# Patient Record
Sex: Female | Born: 1935
Health system: Southern US, Community
[De-identification: ages and names within clinical notes are randomized; demographics above are authoritative.]

## PROBLEM LIST (undated history)

## (undated) DIAGNOSIS — H409 Unspecified glaucoma: Secondary | ICD-10-CM

## (undated) DIAGNOSIS — E039 Hypothyroidism, unspecified: Secondary | ICD-10-CM

## (undated) DIAGNOSIS — I10 Essential (primary) hypertension: Secondary | ICD-10-CM

## (undated) DIAGNOSIS — E7800 Pure hypercholesterolemia, unspecified: Secondary | ICD-10-CM

## (undated) DIAGNOSIS — G2581 Restless legs syndrome: Secondary | ICD-10-CM

## (undated) DIAGNOSIS — Z8669 Personal history of other diseases of the nervous system and sense organs: Secondary | ICD-10-CM

## (undated) DIAGNOSIS — K219 Gastro-esophageal reflux disease without esophagitis: Secondary | ICD-10-CM

## (undated) DIAGNOSIS — M810 Age-related osteoporosis without current pathological fracture: Secondary | ICD-10-CM

## (undated) DIAGNOSIS — IMO0002 Reserved for concepts with insufficient information to code with codable children: Secondary | ICD-10-CM

## (undated) DIAGNOSIS — Z8619 Personal history of other infectious and parasitic diseases: Secondary | ICD-10-CM

## (undated) DIAGNOSIS — E559 Vitamin D deficiency, unspecified: Secondary | ICD-10-CM

## (undated) HISTORY — DX: Gastro-esophageal reflux disease without esophagitis: K21.9

## (undated) HISTORY — DX: Personal history of other diseases of the nervous system and sense organs: Z86.69

## (undated) HISTORY — DX: Reserved for concepts with insufficient information to code with codable children: IMO0002

## (undated) HISTORY — DX: Hypothyroidism, unspecified: E03.9

## (undated) HISTORY — DX: Personal history of other infectious and parasitic diseases: Z86.19

## (undated) HISTORY — DX: Unspecified glaucoma: H40.9

## (undated) HISTORY — DX: Age-related osteoporosis without current pathological fracture: M81.0

## (undated) HISTORY — DX: Pure hypercholesterolemia, unspecified: E78.00

## (undated) HISTORY — DX: Restless legs syndrome: G25.81

## (undated) HISTORY — DX: Vitamin D deficiency, unspecified: E55.9

## (undated) HISTORY — DX: Essential (primary) hypertension: I10

---

## 1966-11-12 HISTORY — PX: APPENDECTOMY: SHX54

## 1966-11-12 HISTORY — PX: ABDOMINAL HYSTERECTOMY: SHX81

## 2005-02-05 ENCOUNTER — Ambulatory Visit: Payer: Self-pay | Admitting: Internal Medicine

## 2005-03-09 ENCOUNTER — Emergency Department: Payer: Self-pay | Admitting: Emergency Medicine

## 2005-05-14 ENCOUNTER — Ambulatory Visit: Payer: Self-pay | Admitting: Internal Medicine

## 2005-08-16 ENCOUNTER — Emergency Department: Payer: Self-pay | Admitting: Internal Medicine

## 2005-08-16 ENCOUNTER — Other Ambulatory Visit: Payer: Self-pay

## 2005-12-28 ENCOUNTER — Ambulatory Visit: Payer: Self-pay | Admitting: Internal Medicine

## 2006-05-23 ENCOUNTER — Ambulatory Visit: Payer: Self-pay | Admitting: Internal Medicine

## 2006-05-30 ENCOUNTER — Ambulatory Visit: Payer: Self-pay | Admitting: Internal Medicine

## 2006-07-19 ENCOUNTER — Ambulatory Visit: Payer: Self-pay | Admitting: Internal Medicine

## 2007-01-17 ENCOUNTER — Ambulatory Visit: Payer: Self-pay | Admitting: Internal Medicine

## 2007-01-22 ENCOUNTER — Ambulatory Visit: Payer: Self-pay | Admitting: Internal Medicine

## 2007-05-27 ENCOUNTER — Ambulatory Visit: Payer: Self-pay | Admitting: Internal Medicine

## 2007-08-08 ENCOUNTER — Ambulatory Visit: Payer: Self-pay | Admitting: Internal Medicine

## 2007-12-20 ENCOUNTER — Inpatient Hospital Stay: Payer: Self-pay | Admitting: Unknown Physician Specialty

## 2007-12-20 ENCOUNTER — Other Ambulatory Visit: Payer: Self-pay

## 2008-05-31 ENCOUNTER — Ambulatory Visit: Payer: Self-pay | Admitting: Internal Medicine

## 2009-06-16 ENCOUNTER — Ambulatory Visit: Payer: Self-pay | Admitting: Internal Medicine

## 2010-08-09 ENCOUNTER — Ambulatory Visit: Payer: Self-pay | Admitting: Internal Medicine

## 2010-08-16 ENCOUNTER — Inpatient Hospital Stay: Payer: Self-pay | Admitting: Internal Medicine

## 2010-08-22 LAB — PATHOLOGY REPORT

## 2010-11-17 ENCOUNTER — Ambulatory Visit: Payer: Self-pay | Admitting: Internal Medicine

## 2011-08-13 ENCOUNTER — Ambulatory Visit: Payer: Self-pay | Admitting: Internal Medicine

## 2012-01-02 DIAGNOSIS — R5381 Other malaise: Secondary | ICD-10-CM | POA: Diagnosis not present

## 2012-01-02 DIAGNOSIS — E559 Vitamin D deficiency, unspecified: Secondary | ICD-10-CM | POA: Diagnosis not present

## 2012-01-02 DIAGNOSIS — E7800 Pure hypercholesterolemia, unspecified: Secondary | ICD-10-CM | POA: Diagnosis not present

## 2012-01-10 DIAGNOSIS — E7800 Pure hypercholesterolemia, unspecified: Secondary | ICD-10-CM | POA: Diagnosis not present

## 2012-01-10 DIAGNOSIS — R1013 Epigastric pain: Secondary | ICD-10-CM | POA: Diagnosis not present

## 2012-01-10 DIAGNOSIS — R1310 Dysphagia, unspecified: Secondary | ICD-10-CM | POA: Diagnosis not present

## 2012-01-10 DIAGNOSIS — I10 Essential (primary) hypertension: Secondary | ICD-10-CM | POA: Diagnosis not present

## 2012-01-14 ENCOUNTER — Ambulatory Visit: Payer: Self-pay | Admitting: Internal Medicine

## 2012-01-14 DIAGNOSIS — N6459 Other signs and symptoms in breast: Secondary | ICD-10-CM | POA: Diagnosis not present

## 2012-01-14 DIAGNOSIS — N644 Mastodynia: Secondary | ICD-10-CM | POA: Diagnosis not present

## 2012-01-16 ENCOUNTER — Ambulatory Visit: Payer: Self-pay | Admitting: Internal Medicine

## 2012-01-16 DIAGNOSIS — R928 Other abnormal and inconclusive findings on diagnostic imaging of breast: Secondary | ICD-10-CM | POA: Diagnosis not present

## 2012-01-17 ENCOUNTER — Ambulatory Visit: Payer: Self-pay | Admitting: Internal Medicine

## 2012-01-17 DIAGNOSIS — R109 Unspecified abdominal pain: Secondary | ICD-10-CM | POA: Diagnosis not present

## 2012-01-17 DIAGNOSIS — N1330 Unspecified hydronephrosis: Secondary | ICD-10-CM | POA: Diagnosis not present

## 2012-01-21 DIAGNOSIS — Z1211 Encounter for screening for malignant neoplasm of colon: Secondary | ICD-10-CM | POA: Diagnosis not present

## 2012-02-07 DIAGNOSIS — K219 Gastro-esophageal reflux disease without esophagitis: Secondary | ICD-10-CM | POA: Diagnosis not present

## 2012-02-07 DIAGNOSIS — R1310 Dysphagia, unspecified: Secondary | ICD-10-CM | POA: Diagnosis not present

## 2012-02-07 DIAGNOSIS — I10 Essential (primary) hypertension: Secondary | ICD-10-CM | POA: Diagnosis not present

## 2012-02-07 DIAGNOSIS — E7800 Pure hypercholesterolemia, unspecified: Secondary | ICD-10-CM | POA: Diagnosis not present

## 2012-05-01 DIAGNOSIS — Z79899 Other long term (current) drug therapy: Secondary | ICD-10-CM | POA: Diagnosis not present

## 2012-05-01 DIAGNOSIS — I10 Essential (primary) hypertension: Secondary | ICD-10-CM | POA: Diagnosis not present

## 2012-05-01 DIAGNOSIS — E559 Vitamin D deficiency, unspecified: Secondary | ICD-10-CM | POA: Diagnosis not present

## 2012-05-01 DIAGNOSIS — E7800 Pure hypercholesterolemia, unspecified: Secondary | ICD-10-CM | POA: Diagnosis not present

## 2012-05-09 DIAGNOSIS — K219 Gastro-esophageal reflux disease without esophagitis: Secondary | ICD-10-CM | POA: Diagnosis not present

## 2012-05-09 DIAGNOSIS — I10 Essential (primary) hypertension: Secondary | ICD-10-CM | POA: Diagnosis not present

## 2012-05-09 DIAGNOSIS — E7800 Pure hypercholesterolemia, unspecified: Secondary | ICD-10-CM | POA: Diagnosis not present

## 2012-07-24 DIAGNOSIS — H04129 Dry eye syndrome of unspecified lacrimal gland: Secondary | ICD-10-CM | POA: Diagnosis not present

## 2012-08-25 DIAGNOSIS — I10 Essential (primary) hypertension: Secondary | ICD-10-CM | POA: Diagnosis not present

## 2012-08-25 DIAGNOSIS — R0602 Shortness of breath: Secondary | ICD-10-CM | POA: Diagnosis not present

## 2012-08-25 DIAGNOSIS — E782 Mixed hyperlipidemia: Secondary | ICD-10-CM | POA: Diagnosis not present

## 2012-10-10 ENCOUNTER — Encounter: Payer: Self-pay | Admitting: Internal Medicine

## 2012-10-21 ENCOUNTER — Encounter: Payer: Self-pay | Admitting: *Deleted

## 2012-10-22 ENCOUNTER — Telehealth: Payer: Self-pay | Admitting: Internal Medicine

## 2012-10-22 ENCOUNTER — Encounter: Payer: Self-pay | Admitting: Internal Medicine

## 2012-10-22 ENCOUNTER — Ambulatory Visit (INDEPENDENT_AMBULATORY_CARE_PROVIDER_SITE_OTHER): Payer: Medicare Other | Admitting: Internal Medicine

## 2012-10-22 VITALS — BP 138/82 | HR 75 | Temp 98.3°F | Ht 63.5 in | Wt 122.5 lb

## 2012-10-22 DIAGNOSIS — E7800 Pure hypercholesterolemia, unspecified: Secondary | ICD-10-CM

## 2012-10-22 DIAGNOSIS — E039 Hypothyroidism, unspecified: Secondary | ICD-10-CM | POA: Diagnosis not present

## 2012-10-22 DIAGNOSIS — R5383 Other fatigue: Secondary | ICD-10-CM

## 2012-10-22 DIAGNOSIS — L989 Disorder of the skin and subcutaneous tissue, unspecified: Secondary | ICD-10-CM

## 2012-10-22 DIAGNOSIS — K219 Gastro-esophageal reflux disease without esophagitis: Secondary | ICD-10-CM | POA: Diagnosis not present

## 2012-10-22 DIAGNOSIS — R5381 Other malaise: Secondary | ICD-10-CM | POA: Diagnosis not present

## 2012-10-22 DIAGNOSIS — M810 Age-related osteoporosis without current pathological fracture: Secondary | ICD-10-CM | POA: Insufficient documentation

## 2012-10-22 DIAGNOSIS — Z23 Encounter for immunization: Secondary | ICD-10-CM | POA: Diagnosis not present

## 2012-10-22 DIAGNOSIS — I10 Essential (primary) hypertension: Secondary | ICD-10-CM

## 2012-10-22 LAB — CBC WITH DIFFERENTIAL/PLATELET
Eosinophils Relative: 1.5 % (ref 0.0–5.0)
Lymphocytes Relative: 35.1 % (ref 12.0–46.0)
MCV: 89.7 fl (ref 78.0–100.0)
Monocytes Absolute: 0.5 10*3/uL (ref 0.1–1.0)
Neutrophils Relative %: 53.8 % (ref 43.0–77.0)
Platelets: 249 10*3/uL (ref 150.0–400.0)
WBC: 5.2 10*3/uL (ref 4.5–10.5)

## 2012-10-22 LAB — COMPREHENSIVE METABOLIC PANEL
ALT: 18 U/L (ref 0–35)
AST: 23 U/L (ref 0–37)
Albumin: 4.4 g/dL (ref 3.5–5.2)
Alkaline Phosphatase: 103 U/L (ref 39–117)
Calcium: 8.9 mg/dL (ref 8.4–10.5)
Chloride: 103 mEq/L (ref 96–112)
Potassium: 4.3 mEq/L (ref 3.5–5.1)
Sodium: 140 mEq/L (ref 135–145)

## 2012-10-22 LAB — LDL CHOLESTEROL, DIRECT: Direct LDL: 148.9 mg/dL

## 2012-10-22 LAB — LIPID PANEL
Cholesterol: 255 mg/dL — ABNORMAL HIGH (ref 0–200)
Total CHOL/HDL Ratio: 3
VLDL: 17 mg/dL (ref 0.0–40.0)

## 2012-10-22 LAB — POCT INFLUENZA A/B

## 2012-10-22 MED ORDER — ALBUTEROL SULFATE HFA 108 (90 BASE) MCG/ACT IN AERS: 2.0000 | INHALATION_SPRAY | Freq: Four times a day (QID) | RESPIRATORY_TRACT | Status: DC | PRN

## 2012-10-22 MED ORDER — PRAVASTATIN SODIUM 10 MG PO TABS: 10.0000 mg | ORAL_TABLET | Freq: Every day | ORAL | Status: DC

## 2012-10-22 MED ORDER — CLONAZEPAM 0.5 MG PO TABS: ORAL_TABLET | ORAL | Status: DC

## 2012-10-22 NOTE — Telephone Encounter (Signed)
Pt notified of lab results and need to start cholesterol medication.  Will start pravastatin 10mg  q mon, wed and Friday.  She is aware of need for liver recheck in 6 weeks.  She is coming 12/03/12 (9:30) for labs.  Please put on lab schedule.  Thanks.

## 2012-10-22 NOTE — Progress Notes (Signed)
  Subjective:    [name], female    DOB: Dec 25, 1932, 78 y.o.   MRN: [id number]  HPI 78 year old female with past history of hypertension, hypercholesterolemia, hypothyroidism and GERD who comes in today for a scheduled follow up.  She states overall she has been doing relatively well.  Her main complaint is that of not being able to sleep.  She has taken short acting meds in the past and tolerates, but still will not sleep through the night.  Needs something to help her sleep.  No chest pain or tightness.  Breathing is stable.  Saw Dr Gwen Pounds 9-10/13.  Felt things were stable.  No nausea or vomiting.  Bowels stable.  Blood pressures overall under reasonable control.    Past Medical History  Diagnosis Date  . Hypertension   . Hypothyroidism   . Hypercholesterolemia   . GERD (gastroesophageal reflux disease)   . Osteoporosis   . Glaucoma   . Restless legs   . History of migraine headaches   . Cystocele     stress incontinence, pessary  . Vitamin D deficiency   . History of chicken pox     Review of Systems Patient denies any headache, lightheadedness or dizziness.  No significant sinus symptoms.  No chest pain, tightness or palpitations.  No increased shortness of breath, cough or congestion.  No nausea or vomiting.  No acid reflux.  No abdominal pain or cramping.  No bowel change, such as diarrhea, constipation, BRBPR or melana.  No urine change.  Trouble sleeping as outlined.  Desires to have medication to help her sleep.  No depression.  Eating and drinking well.        Objective:   Physical Exam Filed Vitals:   10/22/12 1148  BP: 138/82  Pulse: 75  Temp: 98.3 F (59.99 C)   78 year old female in no acute distress.   HEENT:  Nares - clear.  OP- without lesions or erythema.  NECK:  Supple, nontender.   HEART:  Appears to be regular. LUNGS:  Without crackles or wheezing audible.  Respirations even and unlabored.   RADIAL PULSE:  Equal bilaterally.  ABDOMEN:   Soft, nontender.  No audible abdominal bruit.   EXTREMITIES:  No increased edema to be present.                     Assessment & Plan:  GI.  Has had no further bleeding.  Colonoscopy 10/11 revealed congested mucosa and internal hemorrhoids.  On Protonix.  Asymptomatic.    CARDIOVASCULAR.  Sees Dr Gwen Pounds.  Stress test 08/31/10 negative for ischemia.  Continue risk factor modification.    PULMONARY.  Breathing stable.  Follow.    RIGHT CAROTID BRUIT.  Carotid ultrasound 11/17/10 revealed no hemodynamically significant stenosis.  Continue daily aspirin.   DIFFICULTY SLEEPING.  Clonazepam .5mg  1/2 tab q hs prn.  Follow closely.  Discussed possible side effects of medication.  Follow.   HEALTH MAINTENANCE.  Physical 01/10/12.  Mammogram 08/13/11 - BiRADS II.  Obtain results from 2013 mammogram.  Colonoscopy as outlined.  Hemoccult cards negative 01/21/12.

## 2012-10-22 NOTE — Assessment & Plan Note (Signed)
On Protonix.  Asymptomatic.    

## 2012-10-26 ENCOUNTER — Encounter: Payer: Self-pay | Admitting: Internal Medicine

## 2012-10-26 NOTE — Assessment & Plan Note (Signed)
Blood pressure as outlined.  Same meds.  Check metabolic panel.

## 2012-10-26 NOTE — Assessment & Plan Note (Signed)
Low cholesterol diet and exercise.  Check lipid panel.   

## 2012-10-26 NOTE — Assessment & Plan Note (Signed)
Follow vitamin D level.  Continue calcium and vitamin D.  Obtain results of last bone density.

## 2012-10-26 NOTE — Assessment & Plan Note (Signed)
On synthroid.  Follow tsh.   

## 2012-11-06 ENCOUNTER — Other Ambulatory Visit: Payer: Self-pay | Admitting: Internal Medicine

## 2012-11-06 NOTE — Telephone Encounter (Signed)
Sent in to pharmacy.  

## 2012-12-03 ENCOUNTER — Other Ambulatory Visit (INDEPENDENT_AMBULATORY_CARE_PROVIDER_SITE_OTHER): Payer: Medicare Other

## 2012-12-03 DIAGNOSIS — E7800 Pure hypercholesterolemia, unspecified: Secondary | ICD-10-CM | POA: Diagnosis not present

## 2012-12-03 LAB — HEPATIC FUNCTION PANEL
ALT: 17 U/L (ref 0–35)
AST: 25 U/L (ref 0–37)
Albumin: 4 g/dL (ref 3.5–5.2)
Total Bilirubin: 0.6 mg/dL (ref 0.3–1.2)

## 2012-12-04 ENCOUNTER — Other Ambulatory Visit: Payer: Self-pay | Admitting: Internal Medicine

## 2012-12-04 DIAGNOSIS — R7989 Other specified abnormal findings of blood chemistry: Secondary | ICD-10-CM

## 2012-12-04 DIAGNOSIS — I10 Essential (primary) hypertension: Secondary | ICD-10-CM

## 2012-12-04 DIAGNOSIS — E039 Hypothyroidism, unspecified: Secondary | ICD-10-CM

## 2012-12-04 DIAGNOSIS — L578 Other skin changes due to chronic exposure to nonionizing radiation: Secondary | ICD-10-CM | POA: Diagnosis not present

## 2012-12-04 DIAGNOSIS — L820 Inflamed seborrheic keratosis: Secondary | ICD-10-CM | POA: Diagnosis not present

## 2012-12-04 DIAGNOSIS — L821 Other seborrheic keratosis: Secondary | ICD-10-CM | POA: Diagnosis not present

## 2012-12-04 DIAGNOSIS — D239 Other benign neoplasm of skin, unspecified: Secondary | ICD-10-CM | POA: Diagnosis not present

## 2012-12-04 DIAGNOSIS — E7800 Pure hypercholesterolemia, unspecified: Secondary | ICD-10-CM

## 2012-12-04 NOTE — Progress Notes (Signed)
Orders placed for follow up labs - to be done prior to cpe

## 2013-01-21 ENCOUNTER — Other Ambulatory Visit (INDEPENDENT_AMBULATORY_CARE_PROVIDER_SITE_OTHER): Payer: Medicare Other

## 2013-01-21 DIAGNOSIS — E7800 Pure hypercholesterolemia, unspecified: Secondary | ICD-10-CM | POA: Diagnosis not present

## 2013-01-21 DIAGNOSIS — I10 Essential (primary) hypertension: Secondary | ICD-10-CM | POA: Diagnosis not present

## 2013-01-21 DIAGNOSIS — E039 Hypothyroidism, unspecified: Secondary | ICD-10-CM | POA: Diagnosis not present

## 2013-01-21 LAB — HEPATIC FUNCTION PANEL
ALT: 12 U/L (ref 0–35)
AST: 23 U/L (ref 0–37)
Albumin: 4.1 g/dL (ref 3.5–5.2)

## 2013-01-21 LAB — BASIC METABOLIC PANEL
CO2: 24 mEq/L (ref 19–32)
Calcium: 8.9 mg/dL (ref 8.4–10.5)
Glucose, Bld: 104 mg/dL — ABNORMAL HIGH (ref 70–99)
Potassium: 3.6 mEq/L (ref 3.5–5.1)
Sodium: 140 mEq/L (ref 135–145)

## 2013-01-21 LAB — LIPID PANEL
HDL: 83.9 mg/dL (ref >39.00)
Total CHOL/HDL Ratio: 2
Triglycerides: 66 mg/dL (ref 0.0–149.0)
VLDL: 13.2 mg/dL (ref 0.0–40.0)

## 2013-01-21 LAB — TSH: TSH: 2.93 u[IU]/mL (ref 0.35–5.50)

## 2013-01-23 ENCOUNTER — Ambulatory Visit (INDEPENDENT_AMBULATORY_CARE_PROVIDER_SITE_OTHER): Payer: Medicare Other | Admitting: Internal Medicine

## 2013-01-23 ENCOUNTER — Encounter: Payer: Self-pay | Admitting: Internal Medicine

## 2013-01-23 VITALS — BP 130/70 | HR 83 | Temp 97.8°F | Ht 63.5 in | Wt 124.2 lb

## 2013-01-23 DIAGNOSIS — E039 Hypothyroidism, unspecified: Secondary | ICD-10-CM | POA: Diagnosis not present

## 2013-01-23 DIAGNOSIS — K219 Gastro-esophageal reflux disease without esophagitis: Secondary | ICD-10-CM

## 2013-01-23 DIAGNOSIS — E7800 Pure hypercholesterolemia, unspecified: Secondary | ICD-10-CM | POA: Diagnosis not present

## 2013-01-23 DIAGNOSIS — M810 Age-related osteoporosis without current pathological fracture: Secondary | ICD-10-CM

## 2013-01-23 DIAGNOSIS — I10 Essential (primary) hypertension: Secondary | ICD-10-CM

## 2013-01-23 MED ORDER — CLONAZEPAM 0.5 MG PO TABS: ORAL_TABLET | ORAL | Status: DC

## 2013-01-23 MED ORDER — SERTRALINE HCL 50 MG PO TABS: 50.0000 mg | ORAL_TABLET | Freq: Every day | ORAL | Status: DC

## 2013-01-23 MED ORDER — PRAVASTATIN SODIUM 10 MG PO TABS: 10.0000 mg | ORAL_TABLET | Freq: Every day | ORAL | Status: DC

## 2013-01-25 ENCOUNTER — Encounter: Payer: Self-pay | Admitting: Internal Medicine

## 2013-01-25 NOTE — Assessment & Plan Note (Signed)
On synthroid.  Follow tsh.   

## 2013-01-25 NOTE — Progress Notes (Signed)
Subjective:    [name], female    DOB: 02-10-1933, 79 y.o.   MRN: [id number]  HPI 79 year old female with past history of hypertension, hypercholesterolemia, hypothyroidism and GERD who comes in today for a scheduled follow up.  She states overall she has been doing relatively well.  Her main complaint is that of not being able to sleep.  The clonazepam will work for a while, but she is still waking up through the night.  Does worry.  Some question of increased anxiety. No chest pain or tightness.  Breathing is stable.  Saw Dr Gwen Pounds 9-10/13.  Felt things were stable.  No nausea or vomiting.  Bowels stable.  Blood pressures overall under reasonable control.    Past Medical History  Diagnosis Date  . Hypertension   . Hypothyroidism   . Hypercholesterolemia   . GERD (gastroesophageal reflux disease)   . Osteoporosis   . Glaucoma   . Restless legs   . History of migraine headaches   . Cystocele     stress incontinence, pessary  . Vitamin D deficiency   . History of chicken pox     Current Outpatient Prescriptions on File Prior to Visit  Medication Sig Dispense Refill  . albuterol (PROVENTIL HFA;VENTOLIN HFA) 108 (90 BASE) MCG/ACT inhaler Inhale 2 puffs into the lungs every 6 (six) hours as needed for wheezing.  1 Inhaler  1  . amLODipine (NORVASC) 5 MG tablet Take 5 mg by mouth daily.      Marland Kitchen aspirin 81 MG tablet Take 81 mg by mouth daily.      . Calcium Carbonate-Vit D-Min 600-400 MG-UNIT TABS Take by mouth 2 (two) times daily. Take 1 tablet by mouth twice a day      . carbidopa-levodopa (SINEMET IR) 10-100 MG per tablet Take 1 tablet by mouth once. At Bedtime      . Cholecalciferol (VITAMIN D3) 2000 UNITS capsule Take 2,000 Units by mouth daily.      Marland Kitchen levothyroxine (SYNTHROID, LEVOTHROID) 75 MCG tablet Take 75 mcg by mouth daily.      . pantoprazole (PROTONIX) 40 MG tablet TAKE ONE (1) TABLET EACH DAY  90 tablet  1  . telmisartan (MICARDIS) 80 MG tablet Take 80  mg by mouth daily.       No current facility-administered medications on file prior to visit.    Review of Systems Patient denies any headache, lightheadedness or dizziness.  No significant sinus symptoms.  No chest pain, tightness or palpitations.  No increased shortness of breath, cough or congestion.  No nausea or vomiting.  No acid reflux.  No abdominal pain or cramping.  No bowel change, such as diarrhea, constipation, BRBPR or melana.  No urine change.  Trouble sleeping as outlined. Increased worrying.  States her mind won't shut down.   Eating and drinking well.        Objective:   Physical Exam  Filed Vitals:   01/23/13 1449  BP: 130/70  Pulse: 83  Temp: 97.8 F (36.6 C)   Blood pressure recheck:  21/98  79 year old female in no acute distress.   HEENT:  Nares - clear.  OP- without lesions or erythema.  NECK:  Supple, nontender.   HEART:  Appears to be regular. LUNGS:  Without crackles or wheezing audible.  Respirations even and unlabored.   RADIAL PULSE:  Equal bilaterally.  ABDOMEN:  Soft, nontender.  No audible abdominal bruit.   EXTREMITIES:  No increased edema  to be present.                     Assessment & Plan:  GI.  Has had no further bleeding.  Colonoscopy 10/11 revealed congested mucosa and internal hemorrhoids.  On Protonix.  Asymptomatic.    CARDIOVASCULAR.  Sees Dr Gwen Pounds.  Stress test 08/31/10 negative for ischemia.  Continue risk factor modification.    PULMONARY.  Breathing stable.  Follow.    RIGHT CAROTID BRUIT.  Carotid ultrasound 11/17/10 revealed no hemodynamically significant stenosis.  Continue daily aspirin.   DIFFICULTY SLEEPING.  Will continue clonazepam .5mg  q hs prn.  Will also start her on Zologt 25mg  q day and increase to 50mg  q day after one week.  Get her back in soon to reassess.  Any problems before, she is to call or be evaluated.   HEALTH MAINTENANCE.  Physical 01/10/12.  Mammogram 01/14/12 recommended follow up views.  Follow up  views 01/16/12 - BiRADS II.   Colonoscopy as outlined.  Hemoccult cards negative 01/21/12.

## 2013-01-25 NOTE — Assessment & Plan Note (Signed)
Blood pressure as outlined.  Same meds.  Follow metabolic panel.   

## 2013-01-25 NOTE — Assessment & Plan Note (Signed)
On Protonix.  Asymptomatic.    

## 2013-01-25 NOTE — Assessment & Plan Note (Signed)
Low cholesterol diet and exercise.  Follow lipid panel.   

## 2013-01-25 NOTE — Assessment & Plan Note (Signed)
Follow vitamin D level.  Continue calcium and vitamin D.

## 2013-02-02 DIAGNOSIS — L821 Other seborrheic keratosis: Secondary | ICD-10-CM | POA: Diagnosis not present

## 2013-02-02 DIAGNOSIS — L820 Inflamed seborrheic keratosis: Secondary | ICD-10-CM | POA: Diagnosis not present

## 2013-02-02 DIAGNOSIS — L578 Other skin changes due to chronic exposure to nonionizing radiation: Secondary | ICD-10-CM | POA: Diagnosis not present

## 2013-02-12 ENCOUNTER — Other Ambulatory Visit: Payer: Self-pay | Admitting: Internal Medicine

## 2013-02-12 NOTE — Telephone Encounter (Signed)
Med filled.  

## 2013-03-05 ENCOUNTER — Encounter: Payer: Self-pay | Admitting: Internal Medicine

## 2013-03-05 ENCOUNTER — Ambulatory Visit (INDEPENDENT_AMBULATORY_CARE_PROVIDER_SITE_OTHER): Payer: Medicare Other | Admitting: Internal Medicine

## 2013-03-05 VITALS — BP 130/80 | HR 82 | Temp 98.5°F | Ht 63.5 in | Wt 124.0 lb

## 2013-03-05 DIAGNOSIS — I10 Essential (primary) hypertension: Secondary | ICD-10-CM

## 2013-03-05 DIAGNOSIS — M810 Age-related osteoporosis without current pathological fracture: Secondary | ICD-10-CM

## 2013-03-05 DIAGNOSIS — E7800 Pure hypercholesterolemia, unspecified: Secondary | ICD-10-CM | POA: Diagnosis not present

## 2013-03-05 DIAGNOSIS — Z1239 Encounter for other screening for malignant neoplasm of breast: Secondary | ICD-10-CM | POA: Diagnosis not present

## 2013-03-05 DIAGNOSIS — K219 Gastro-esophageal reflux disease without esophagitis: Secondary | ICD-10-CM

## 2013-03-05 DIAGNOSIS — E039 Hypothyroidism, unspecified: Secondary | ICD-10-CM

## 2013-03-05 MED ORDER — CLONAZEPAM 0.5 MG PO TABS: ORAL_TABLET | ORAL | Status: DC

## 2013-03-05 MED ORDER — TELMISARTAN 80 MG PO TABS: 80.0000 mg | ORAL_TABLET | Freq: Every day | ORAL | Status: DC

## 2013-03-05 MED ORDER — PRAVASTATIN SODIUM 10 MG PO TABS: 10.0000 mg | ORAL_TABLET | Freq: Every day | ORAL | Status: DC

## 2013-03-08 ENCOUNTER — Encounter: Payer: Self-pay | Admitting: Internal Medicine

## 2013-03-08 NOTE — Assessment & Plan Note (Signed)
Follow vitamin D level.  Continue calcium and vitamin D.

## 2013-03-08 NOTE — Assessment & Plan Note (Signed)
Low cholesterol diet and exercise.  Follow lipid panel.  Cholesterol just checked 01/21/13 - total cholesterol 198, triglycerides 66, HDL 84 and LDL 101.

## 2013-03-08 NOTE — Assessment & Plan Note (Signed)
On Protonix.  Asymptomatic.    

## 2013-03-08 NOTE — Assessment & Plan Note (Signed)
Blood pressure as outlined.  Same meds.  Follow metabolic panel.   

## 2013-03-08 NOTE — Progress Notes (Signed)
Subjective:    [name], female    DOB: Jul 22, 1933, 79 y.o.   MRN: [id number]  HPI 79 year old female with past history of hypertension, hypercholesterolemia, hypothyroidism and GERD who comes in today to follow up on these issues as well as for a complete physical exam. She states overall she has been doing relatively well.  Her main complaint is that of pain in the vein at the base of her left thumb.  No pain in the joint.  No increased erythema.  She states there was increased warmth.  No pain with rotation of her thumb.  Pain localized to the vein.  No injury or trauma. No chest pain or tightness.  Breathing is stable.  Saw Dr Gwen Pounds 9-10/13.  Felt things were stable.  No nausea or vomiting.  Bowels stable.  Blood pressures have been controlled.     Past Medical History  Diagnosis Date  . Hypertension   . Hypothyroidism   . Hypercholesterolemia   . GERD (gastroesophageal reflux disease)   . Osteoporosis   . Glaucoma   . Restless legs   . History of migraine headaches   . Cystocele     stress incontinence, pessary  . Vitamin D deficiency   . History of chicken pox     Current Outpatient Prescriptions on File Prior to Visit  Medication Sig Dispense Refill  . albuterol (PROVENTIL HFA;VENTOLIN HFA) 108 (90 BASE) MCG/ACT inhaler Inhale 2 puffs into the lungs every 6 (six) hours as needed for wheezing.  1 Inhaler  1  . amLODipine (NORVASC) 5 MG tablet TAKE ONE (1) TABLET EACH DAY  90 tablet  1  . aspirin 81 MG tablet Take 81 mg by mouth daily.      . Calcium Carbonate-Vit D-Min 600-400 MG-UNIT TABS Take by mouth 2 (two) times daily. Take 1 tablet by mouth twice a day      . carbidopa-levodopa (SINEMET IR) 10-100 MG per tablet TAKE ONE TABLET AT BEDTIME  90 tablet  1  . Cholecalciferol (VITAMIN D3) 2000 UNITS capsule Take 2,000 Units by mouth daily.      Marland Kitchen levothyroxine (SYNTHROID, LEVOTHROID) 75 MCG tablet TAKE ONE (1) TABLET EACH DAY  90 tablet  1  . pantoprazole  (PROTONIX) 40 MG tablet TAKE ONE (1) TABLET EACH DAY  90 tablet  1  . sertraline (ZOLOFT) 50 MG tablet Take 1 tablet (50 mg total) by mouth daily.  30 tablet  2   No current facility-administered medications on file prior to visit.    Review of Systems Patient denies any headache, lightheadedness or dizziness.  No significant sinus symptoms.  No chest pain, tightness or palpitations.  No increased shortness of breath, cough or congestion.  No nausea or vomiting.  No acid reflux.  No abdominal pain or cramping.  No bowel change, such as diarrhea, constipation, BRBPR or melana.  No urine change.  Eating and drinking well.  Pain over the vein - base of her left thumb as outlined.       Objective:   Physical Exam  Filed Vitals:   03/05/13 1514  BP: 130/80  Pulse: 82  Temp: 98.5 F (18.1 C)   79 year old female in no acute distress.   HEENT:  Nares- clear.  Oropharynx - without lesions. NECK:  Supple.  Nontender.  No audible bruit.  HEART:  Appears to be regular. LUNGS:  No crackles or wheezing audible.  Respirations even and unlabored.  RADIAL PULSE:  Equal bilaterally.    BREASTS:  No nipple discharge or nipple retraction present.  Could not appreciate any distinct nodules or axillary adenopathy.  ABDOMEN:  Soft, nontender.  Bowel sounds present and normal.  No audible abdominal bruit.  GU:  Normal external genitalia.  Vaginal vault without lesions.  S/p hysterectomy.   Could not appreciate any adnexal masses or tenderness.   RECTAL:  Heme negative.   EXTREMITIES:  No increased edema present.  DP pulses palpable and equal bilaterally.  MSK:  Increased tenderness to palpation over the vein at the base of the left thumb.  No increased erythema.  No pain in the joint.  No pain with movement of the thumb.  No bruising.            Assessment & Plan:  QUESTION OF PHLEBITIS.  Pain localized over the vein - base of left thumb.  Treat with gentle use of antiinflammatories.  Follow.  Will call  with update over the next few days.  Follow.  No evidence of infection.   GI.  Has had no further bleeding.  Colonoscopy 10/11 revealed congested mucosa and internal hemorrhoids.  On Protonix.  Asymptomatic.    CARDIOVASCULAR.  Sees Dr Gwen Pounds.  Stress test 08/31/10 negative for ischemia.  Continue risk factor modification.    PULMONARY.  Breathing stable.  Follow.    RIGHT CAROTID BRUIT.  Carotid ultrasound 11/17/10 revealed no hemodynamically significant stenosis.  Continue daily aspirin.   DIFFICULTY SLEEPING.  On clonazepam .5mg  q hs prn.  Also on Zoloft 50mg  q day.  Doing better.  Follow.    HEALTH MAINTENANCE.  Physical today.  Mammogram 01/14/12 recommended follow up views.  Follow up views 01/16/12 - BiRADS II.   Schedule follow up mammogram.   Colonoscopy as outlined.

## 2013-03-08 NOTE — Assessment & Plan Note (Signed)
On synthroid.  Follow tsh.   

## 2013-03-31 DIAGNOSIS — L819 Disorder of pigmentation, unspecified: Secondary | ICD-10-CM | POA: Diagnosis not present

## 2013-03-31 DIAGNOSIS — L821 Other seborrheic keratosis: Secondary | ICD-10-CM | POA: Diagnosis not present

## 2013-03-31 DIAGNOSIS — L820 Inflamed seborrheic keratosis: Secondary | ICD-10-CM | POA: Diagnosis not present

## 2013-03-31 DIAGNOSIS — L578 Other skin changes due to chronic exposure to nonionizing radiation: Secondary | ICD-10-CM | POA: Diagnosis not present

## 2013-04-01 ENCOUNTER — Ambulatory Visit: Payer: Self-pay | Admitting: Internal Medicine

## 2013-04-01 DIAGNOSIS — Z1231 Encounter for screening mammogram for malignant neoplasm of breast: Secondary | ICD-10-CM | POA: Diagnosis not present

## 2013-04-01 DIAGNOSIS — N6459 Other signs and symptoms in breast: Secondary | ICD-10-CM | POA: Diagnosis not present

## 2013-04-02 ENCOUNTER — Telehealth: Payer: Self-pay | Admitting: Internal Medicine

## 2013-04-02 ENCOUNTER — Ambulatory Visit: Payer: Self-pay | Admitting: Internal Medicine

## 2013-04-02 DIAGNOSIS — N6459 Other signs and symptoms in breast: Secondary | ICD-10-CM | POA: Diagnosis not present

## 2013-04-02 DIAGNOSIS — R928 Other abnormal and inconclusive findings on diagnostic imaging of breast: Secondary | ICD-10-CM | POA: Diagnosis not present

## 2013-04-02 NOTE — Telephone Encounter (Signed)
Results are in Dr Roby Lofts folder to be reviewed on 5/23

## 2013-04-02 NOTE — Telephone Encounter (Signed)
Mammogram 5/21 showed asymmetry in the left breast. They recommended additional views. Has this been scheduled and has pt been informed?

## 2013-04-30 ENCOUNTER — Encounter: Payer: Self-pay | Admitting: Internal Medicine

## 2013-05-12 DIAGNOSIS — L299 Pruritus, unspecified: Secondary | ICD-10-CM | POA: Diagnosis not present

## 2013-05-12 DIAGNOSIS — L820 Inflamed seborrheic keratosis: Secondary | ICD-10-CM | POA: Diagnosis not present

## 2013-05-12 DIAGNOSIS — L821 Other seborrheic keratosis: Secondary | ICD-10-CM | POA: Diagnosis not present

## 2013-05-23 ENCOUNTER — Other Ambulatory Visit: Payer: Self-pay | Admitting: Internal Medicine

## 2013-05-25 ENCOUNTER — Other Ambulatory Visit: Payer: Self-pay | Admitting: *Deleted

## 2013-05-25 MED ORDER — PANTOPRAZOLE SODIUM 40 MG PO TBEC: DELAYED_RELEASE_TABLET | ORAL | Status: DC

## 2013-07-01 ENCOUNTER — Encounter: Payer: Self-pay | Admitting: *Deleted

## 2013-07-02 ENCOUNTER — Other Ambulatory Visit (INDEPENDENT_AMBULATORY_CARE_PROVIDER_SITE_OTHER): Payer: Medicare Other

## 2013-07-02 DIAGNOSIS — E7800 Pure hypercholesterolemia, unspecified: Secondary | ICD-10-CM | POA: Diagnosis not present

## 2013-07-02 DIAGNOSIS — I10 Essential (primary) hypertension: Secondary | ICD-10-CM

## 2013-07-02 LAB — LIPID PANEL
Cholesterol: 233 mg/dL — ABNORMAL HIGH (ref 0–200)
HDL: 81.9 mg/dL (ref >39.00)
Triglycerides: 57 mg/dL (ref 0.0–149.0)

## 2013-07-02 LAB — LDL CHOLESTEROL, DIRECT: Direct LDL: 139 mg/dL

## 2013-07-02 LAB — BASIC METABOLIC PANEL
Calcium: 9 mg/dL (ref 8.4–10.5)
Creatinine, Ser: 0.7 mg/dL (ref 0.4–1.2)
GFR: 81.86 mL/min (ref >60.00)
Sodium: 139 mEq/L (ref 135–145)

## 2013-07-03 ENCOUNTER — Encounter: Payer: Self-pay | Admitting: *Deleted

## 2013-07-06 ENCOUNTER — Ambulatory Visit: Payer: Medicare Other | Admitting: Internal Medicine

## 2013-07-14 ENCOUNTER — Telehealth: Payer: Self-pay | Admitting: *Deleted

## 2013-07-14 NOTE — Telephone Encounter (Signed)
Pt notified of lab results- Total & LDL chol has increased. Low chol diet & exercise. Will follow. Metabolic panel okay

## 2013-07-17 ENCOUNTER — Encounter: Payer: Self-pay | Admitting: *Deleted

## 2013-07-20 ENCOUNTER — Ambulatory Visit (INDEPENDENT_AMBULATORY_CARE_PROVIDER_SITE_OTHER): Payer: Medicare Other | Admitting: Internal Medicine

## 2013-07-20 ENCOUNTER — Encounter: Payer: Self-pay | Admitting: Internal Medicine

## 2013-07-20 VITALS — BP 124/80 | HR 77 | Temp 98.6°F | Ht 63.5 in | Wt 121.8 lb

## 2013-07-20 DIAGNOSIS — M810 Age-related osteoporosis without current pathological fracture: Secondary | ICD-10-CM | POA: Diagnosis not present

## 2013-07-20 DIAGNOSIS — E039 Hypothyroidism, unspecified: Secondary | ICD-10-CM | POA: Diagnosis not present

## 2013-07-20 DIAGNOSIS — E7800 Pure hypercholesterolemia, unspecified: Secondary | ICD-10-CM

## 2013-07-20 DIAGNOSIS — Z23 Encounter for immunization: Secondary | ICD-10-CM | POA: Diagnosis not present

## 2013-07-20 DIAGNOSIS — K219 Gastro-esophageal reflux disease without esophagitis: Secondary | ICD-10-CM

## 2013-07-20 DIAGNOSIS — I10 Essential (primary) hypertension: Secondary | ICD-10-CM

## 2013-07-20 DIAGNOSIS — G4700 Insomnia, unspecified: Secondary | ICD-10-CM | POA: Diagnosis not present

## 2013-07-20 MED ORDER — TRAZODONE HCL 50 MG PO TABS: 25.0000 mg | ORAL_TABLET | Freq: Every evening | ORAL | Status: DC | PRN

## 2013-07-23 ENCOUNTER — Encounter: Payer: Self-pay | Admitting: Internal Medicine

## 2013-07-23 ENCOUNTER — Other Ambulatory Visit: Payer: Self-pay | Admitting: Internal Medicine

## 2013-07-23 DIAGNOSIS — G4700 Insomnia, unspecified: Secondary | ICD-10-CM | POA: Insufficient documentation

## 2013-07-23 DIAGNOSIS — R52 Pain, unspecified: Secondary | ICD-10-CM

## 2013-07-23 NOTE — Assessment & Plan Note (Signed)
Follow vitamin D level.  Continue calcium and vitamin D.

## 2013-07-23 NOTE — Assessment & Plan Note (Signed)
On Protonix.  Asymptomatic.    

## 2013-07-23 NOTE — Progress Notes (Signed)
Subjective:    [name], female    DOB: 1932-12-31, 79 y.o.   MRN: [id number]  HPI 79 year old female with past history of hypertension, hypercholesterolemia, hypothyroidism and GERD who comes in today for a scheduled follow up.  She still reports that she is having pain in the vein at the base of her left thumb.  No pain in the joint.  No increased erythema.  She states there is increased warmth at times.  No pain with rotation of her thumb.  Pain localized to the vein.  Persistent for months now.  She also reports that approximately one month ago, she developed pain in the center of her chest.  EMS called.  States everything checked out fine.  She declined evaluation in the ER.  Pain lasted two days.  Has not occurred since.  Breathing is stable.  Saw Dr Gwen Pounds 9-10/13.  Felt things were stable.  No nausea or vomiting.  Bowels stable.  Blood pressures have been controlled.  She has started back walking.  No chest pain or tightness with increased activity or exertion.  She does report she is sleeping some better.  Still with increased stress.  On clonazepam.    Past Medical History  Diagnosis Date  . Hypertension   . Hypothyroidism   . Hypercholesterolemia   . GERD (gastroesophageal reflux disease)   . Osteoporosis   . Glaucoma   . Restless legs   . History of migraine headaches   . Cystocele     stress incontinence, pessary  . Vitamin D deficiency   . History of chicken pox     Current Outpatient Prescriptions on File Prior to Visit  Medication Sig Dispense Refill  . albuterol (PROVENTIL HFA;VENTOLIN HFA) 108 (90 BASE) MCG/ACT inhaler Inhale 2 puffs into the lungs every 6 (six) hours as needed for wheezing.  1 Inhaler  1  . amLODipine (NORVASC) 5 MG tablet TAKE ONE (1) TABLET EACH DAY  90 tablet  1  . aspirin 81 MG tablet Take 81 mg by mouth daily.      . Calcium Carbonate-Vit D-Min 600-400 MG-UNIT TABS Take by mouth 2 (two) times daily. Take 1 tablet by mouth  twice a day      . carbidopa-levodopa (SINEMET IR) 10-100 MG per tablet TAKE ONE TABLET AT BEDTIME  90 tablet  1  . Cholecalciferol (VITAMIN D3) 2000 UNITS capsule Take 2,000 Units by mouth daily.      Marland Kitchen levothyroxine (SYNTHROID, LEVOTHROID) 75 MCG tablet TAKE ONE (1) TABLET EACH DAY  90 tablet  1  . pantoprazole (PROTONIX) 40 MG tablet TAKE ONE (1) TABLET EACH DAY  90 tablet  1  . pravastatin (PRAVACHOL) 10 MG tablet Take 1 tablet (10 mg total) by mouth daily.  90 tablet  3  . sertraline (ZOLOFT) 50 MG tablet Take 1 tablet (50 mg total) by mouth daily.  30 tablet  2  . telmisartan (MICARDIS) 80 MG tablet Take 1 tablet (80 mg total) by mouth daily.  90 tablet  3   No current facility-administered medications on file prior to visit.    Review of Systems Patient denies any headache, lightheadedness or dizziness.  No significant sinus symptoms.  No chest pain, tightness or palpitations now.  Previous episode of pain in the center of her chest.  No increased shortness of breath, cough or congestion.  Breathing stable.  No nausea or vomiting.  No acid reflux.  No abdominal pain or cramping.  No  bowel change, such as diarrhea, constipation, BRBPR or melana.  No urine change.  Eating and drinking well.  Pain over the vein - base of her left thumb as outlined.  Increased stress.  May need something more to help her sleep.       Objective:   Physical Exam  Filed Vitals:   07/20/13 0953  BP: 124/80  Pulse: 77  Temp: 98.6 F (56 C)   79 year old female in no acute distress.   HEENT:  Nares- clear.  Oropharynx - without lesions. NECK:  Supple.  Nontender.  No audible bruit.  HEART:  Appears to be regular. LUNGS:  No crackles or wheezing audible.  Respirations even and unlabored.  RADIAL PULSE:  Equal bilaterally.   ABDOMEN:  Soft, nontender.  Bowel sounds present and normal.  No audible abdominal bruit.    EXTREMITIES:  No increased edema present.  DP pulses palpable and equal bilaterally.   VASCULAR:  Increased tenderness to palpation over the vein at the base of the left thumb.  No increased erythema.  No pain in the joint.  No pain with movement of the thumb.  No bruising.            Assessment & Plan:  QUESTION OF PHLEBITIS.  Persistent pain localized over the vein - base of left thumb.  Treated with gentle use of antiinflammatories.  No improvement.  Will refer to vascular surgery for evaluation.    GI.  Has had no further bleeding.  Colonoscopy 10/11 revealed congested mucosa and internal hemorrhoids.  On Protonix.  Asymptomatic.    CARDIOVASCULAR.  Sees Dr Gwen Pounds.  Stress test 08/31/10 negative for ischemia.  Had the episode of chest pain as outlined.  Declined ER evaluation.  Discussed with her today regarding further cardiac w/up.  She declines.  Wants to monitor.  Will notify me of be reevaluated if symptoms reoccur.    PULMONARY.  Breathing stable.  Follow.    RIGHT CAROTID BRUIT.  Carotid ultrasound 11/17/10 revealed no hemodynamically significant stenosis.  Continue daily aspirin.    HEALTH MAINTENANCE.  Physical 03/05/13.   Mammogram 01/14/12 recommended follow up views.  Follow up views 01/16/12 - BiRADS II.   Mammogram 04/01/13 recommended follow up views.  Follow up mammogram 04/02/13 - Birads II.   Colonoscopy as outlined.

## 2013-07-23 NOTE — Assessment & Plan Note (Signed)
Blood pressure as outlined.  Same meds.  Follow metabolic panel.   

## 2013-07-23 NOTE — Assessment & Plan Note (Signed)
Low cholesterol diet and exercise.  Follow lipid panel.  Cholesterol just checked 07/02/13 - total cholesterol 233, triglycerides 57, HDL 82 and LDL 139.  Continue current med regimen.

## 2013-07-23 NOTE — Assessment & Plan Note (Signed)
Will have her taper off clonazepam.  Start trazodone as directed.  Follow closely.  Get her back in soon to reassess.  Titrate up if needed.

## 2013-07-23 NOTE — Assessment & Plan Note (Signed)
On synthroid.  Follow tsh.   

## 2013-07-23 NOTE — Progress Notes (Signed)
Order placed for vascular surgery referral.   

## 2013-07-30 DIAGNOSIS — M79609 Pain in unspecified limb: Secondary | ICD-10-CM | POA: Diagnosis not present

## 2013-07-30 DIAGNOSIS — M1990 Unspecified osteoarthritis, unspecified site: Secondary | ICD-10-CM | POA: Diagnosis not present

## 2013-07-30 DIAGNOSIS — E785 Hyperlipidemia, unspecified: Secondary | ICD-10-CM | POA: Diagnosis not present

## 2013-07-30 DIAGNOSIS — I10 Essential (primary) hypertension: Secondary | ICD-10-CM | POA: Diagnosis not present

## 2013-08-03 ENCOUNTER — Encounter: Payer: Self-pay | Admitting: Internal Medicine

## 2013-08-25 ENCOUNTER — Other Ambulatory Visit: Payer: Self-pay | Admitting: Internal Medicine

## 2013-08-28 ENCOUNTER — Encounter: Payer: Self-pay | Admitting: Internal Medicine

## 2013-08-28 ENCOUNTER — Ambulatory Visit (INDEPENDENT_AMBULATORY_CARE_PROVIDER_SITE_OTHER): Payer: Medicare Other | Admitting: Internal Medicine

## 2013-08-28 VITALS — BP 138/90 | HR 65 | Temp 97.9°F | Ht 63.5 in | Wt 122.5 lb

## 2013-08-28 DIAGNOSIS — E7800 Pure hypercholesterolemia, unspecified: Secondary | ICD-10-CM

## 2013-08-28 DIAGNOSIS — I10 Essential (primary) hypertension: Secondary | ICD-10-CM | POA: Diagnosis not present

## 2013-08-28 DIAGNOSIS — E039 Hypothyroidism, unspecified: Secondary | ICD-10-CM | POA: Diagnosis not present

## 2013-08-28 DIAGNOSIS — G4700 Insomnia, unspecified: Secondary | ICD-10-CM

## 2013-08-28 DIAGNOSIS — K219 Gastro-esophageal reflux disease without esophagitis: Secondary | ICD-10-CM

## 2013-08-28 DIAGNOSIS — M810 Age-related osteoporosis without current pathological fracture: Secondary | ICD-10-CM | POA: Diagnosis not present

## 2013-08-30 ENCOUNTER — Encounter: Payer: Self-pay | Admitting: Internal Medicine

## 2013-08-30 NOTE — Assessment & Plan Note (Signed)
On trazodone and tolerating.  Helping.  Will increase to 50mg  q hs.  Follow.

## 2013-08-30 NOTE — Progress Notes (Signed)
Subjective:    [name], female    DOB: 26-Oct-1933, 79 y.o.   MRN: [id number]  HPI 79 year old female with past history of hypertension, hypercholesterolemia, hypothyroidism and GERD who comes in today for a scheduled follow up.  She still reports that she is having pain in the vein at the base of her left thumb.  No pain in the joint.  No increased erythema.  She saw vascular surgery.  They had discussed possible procedure.  She wants to just monitor for now.  Taking some ibuprofen. Is better.   She reports noticing an intermittent pulling sensation in her chest.  Will hurt into her back as well.  No precipitating factors.  Can walk and exercise without the pain.   Breathing is stable.  Saw Dr Kaitlyn Bowen 9-10/13.  Felt things were stable.  No nausea or vomiting.  Bowels stable.  Blood pressures have been controlled.  She reports blood pressures averaging 120-130s.  She does report she is sleeping some better.  Still with increased stress.  On trazodone to help her sleep.  Is helping her.  May need a little higher dose.     Past Medical History  Diagnosis Date  . Hypertension   . Hypothyroidism   . Hypercholesterolemia   . GERD (gastroesophageal reflux disease)   . Osteoporosis   . Glaucoma   . Restless legs   . History of migraine headaches   . Cystocele     stress incontinence, pessary  . Vitamin D deficiency   . History of chicken pox     Current Outpatient Prescriptions on File Prior to Visit  Medication Sig Dispense Refill  . albuterol (PROVENTIL HFA;VENTOLIN HFA) 108 (90 BASE) MCG/ACT inhaler Inhale 2 puffs into the lungs every 6 (six) hours as needed for wheezing.  1 Inhaler  1  . amLODipine (NORVASC) 5 MG tablet TAKE 1 TABLET BY MOUTH EVERY DAY  90 tablet  1  . aspirin 81 MG tablet Take 81 mg by mouth daily.      . Calcium Carbonate-Vit D-Min 600-400 MG-UNIT TABS Take by mouth 2 (two) times daily. Take 1 tablet by mouth twice a day      . carbidopa-levodopa  (SINEMET IR) 10-100 MG per tablet TAKE 1 TABLET BY MOUTH AT BEDTIME  90 tablet  1  . Cholecalciferol (VITAMIN D3) 2000 UNITS capsule Take 2,000 Units by mouth daily.      Marland Kitchen levothyroxine (SYNTHROID, LEVOTHROID) 75 MCG tablet TAKE ONE (1) TABLET EACH DAY  90 tablet  1  . pantoprazole (PROTONIX) 40 MG tablet TAKE ONE (1) TABLET EACH DAY  90 tablet  1  . pravastatin (PRAVACHOL) 10 MG tablet Take 1 tablet (10 mg total) by mouth daily.  90 tablet  3  . sertraline (ZOLOFT) 50 MG tablet Take 1 tablet (50 mg total) by mouth daily.  30 tablet  2  . telmisartan (MICARDIS) 80 MG tablet Take 1 tablet (80 mg total) by mouth daily.  90 tablet  3  . traZODone (DESYREL) 50 MG tablet Take 0.5-1 tablets (25-50 mg total) by mouth at bedtime as needed for sleep.  30 tablet  1   No current facility-administered medications on file prior to visit.    Review of Systems Patient denies any headache, lightheadedness or dizziness.  No significant sinus symptoms.  No chest pain, tightness or palpitations now.  Intermittent chest discomfort as outlined.   No increased shortness of breath, cough or congestion.  Breathing stable.  No nausea or vomiting.  No acid reflux.  No abdominal pain or cramping.  No bowel change, such as diarrhea, constipation, BRBPR or melana.  No urine change.  Eating and drinking well.  Pain over the vein - base of her left thumb as outlined.  Increased stress.   Feels she is handling things relatively well.  Discussed increasing trazodone to help her sleep.        Objective:   Physical Exam  Filed Vitals:   08/28/13 1042  BP: 138/90  Pulse: 65  Temp: 97.9 F (36.6 C)   Blood pressure recheck:  128-132/78-80, pulse 28  79 year old female in no acute distress.   HEENT:  Nares- clear.  Oropharynx - without lesions. NECK:  Supple.  Nontender.  No audible bruit.  HEART:  Appears to be regular. LUNGS:  No crackles or wheezing audible.  Respirations even and unlabored.  RADIAL PULSE:  Equal  bilaterally.   ABDOMEN:  Soft, nontender.  Bowel sounds present and normal.  No audible abdominal bruit.    EXTREMITIES:  No increased edema present.  DP pulses palpable and equal bilaterally.  VASCULAR:  Increased tenderness to palpation over the vein at the base of the left thumb.  No increased erythema.  No pain in the joint.  No pain with movement of the thumb.  No bruising.            Assessment & Plan:  QUESTION OF PHLEBITIS.  Persistent pain localized over the vein - base of left thumb.  Treated with gentle use of antiinflammatories.  Saw vascular.  Offered procedure.  She wants to monitor.    GI.  Has had no further bleeding.  Colonoscopy 10/11 revealed congested mucosa and internal hemorrhoids.  On Protonix.  Asymptomatic.    CARDIOVASCULAR.  Sees Dr Kaitlyn Bowen.  Stress test 08/31/10 negative for ischemia.  Having intermittent chest discomfort as outlined.  Discussed with her today regarding further w/up.  She declines.  Wants to monitor.  Will notify me of be reevaluated if she changes her mind.  Overall feels stable.   PULMONARY.  Breathing stable.  Follow.    RIGHT CAROTID BRUIT.  Carotid ultrasound 11/17/10 revealed no hemodynamically significant stenosis.  Continue daily aspirin.    HEALTH MAINTENANCE.  Physical 03/05/13.   Mammogram 01/14/12 recommended follow up views.  Follow up views 01/16/12 - BiRADS II.   Mammogram 04/01/13 recommended follow up views.  Follow up mammogram 04/02/13 - Birads II.   Colonoscopy as outlined.

## 2013-08-30 NOTE — Assessment & Plan Note (Signed)
Blood pressure as outlined.  Same meds.  Follow metabolic panel.   

## 2013-08-30 NOTE — Assessment & Plan Note (Signed)
On Protonix.  Asymptomatic.    

## 2013-08-30 NOTE — Assessment & Plan Note (Signed)
Follow vitamin D level.  Continue vitamin D supplementation.   

## 2013-08-30 NOTE — Assessment & Plan Note (Signed)
Low cholesterol diet and exercise.  Follow lipid panel.  Cholesterol just checked 07/02/13 - total cholesterol 233, triglycerides 57, HDL 82 and LDL 139.  Continue current med regimen.

## 2013-08-30 NOTE — Assessment & Plan Note (Signed)
On synthroid.  Follow tsh.   

## 2013-09-08 DIAGNOSIS — I059 Rheumatic mitral valve disease, unspecified: Secondary | ICD-10-CM | POA: Diagnosis not present

## 2013-09-08 DIAGNOSIS — R0602 Shortness of breath: Secondary | ICD-10-CM | POA: Diagnosis not present

## 2013-09-08 DIAGNOSIS — I119 Hypertensive heart disease without heart failure: Secondary | ICD-10-CM | POA: Diagnosis not present

## 2013-09-08 DIAGNOSIS — I209 Angina pectoris, unspecified: Secondary | ICD-10-CM | POA: Diagnosis not present

## 2013-09-12 ENCOUNTER — Other Ambulatory Visit: Payer: Self-pay | Admitting: Internal Medicine

## 2013-09-21 ENCOUNTER — Other Ambulatory Visit: Payer: Self-pay | Admitting: Internal Medicine

## 2013-09-28 DIAGNOSIS — I6529 Occlusion and stenosis of unspecified carotid artery: Secondary | ICD-10-CM | POA: Diagnosis not present

## 2013-09-28 DIAGNOSIS — E782 Mixed hyperlipidemia: Secondary | ICD-10-CM | POA: Diagnosis not present

## 2013-09-28 DIAGNOSIS — I119 Hypertensive heart disease without heart failure: Secondary | ICD-10-CM | POA: Diagnosis not present

## 2013-09-28 DIAGNOSIS — R0789 Other chest pain: Secondary | ICD-10-CM | POA: Diagnosis not present

## 2013-10-01 DIAGNOSIS — Z961 Presence of intraocular lens: Secondary | ICD-10-CM | POA: Diagnosis not present

## 2013-10-26 DIAGNOSIS — R9430 Abnormal result of cardiovascular function study, unspecified: Secondary | ICD-10-CM | POA: Diagnosis not present

## 2013-10-26 DIAGNOSIS — I209 Angina pectoris, unspecified: Secondary | ICD-10-CM | POA: Diagnosis not present

## 2013-10-26 DIAGNOSIS — E782 Mixed hyperlipidemia: Secondary | ICD-10-CM | POA: Diagnosis not present

## 2013-10-26 DIAGNOSIS — I119 Hypertensive heart disease without heart failure: Secondary | ICD-10-CM | POA: Diagnosis not present

## 2013-10-29 ENCOUNTER — Ambulatory Visit: Payer: Self-pay | Admitting: Internal Medicine

## 2013-10-29 DIAGNOSIS — I2510 Atherosclerotic heart disease of native coronary artery without angina pectoris: Secondary | ICD-10-CM | POA: Diagnosis not present

## 2013-10-29 DIAGNOSIS — Z8041 Family history of malignant neoplasm of ovary: Secondary | ICD-10-CM | POA: Diagnosis not present

## 2013-10-29 DIAGNOSIS — I739 Peripheral vascular disease, unspecified: Secondary | ICD-10-CM | POA: Diagnosis not present

## 2013-10-29 DIAGNOSIS — I6529 Occlusion and stenosis of unspecified carotid artery: Secondary | ICD-10-CM | POA: Diagnosis not present

## 2013-10-29 DIAGNOSIS — Z823 Family history of stroke: Secondary | ICD-10-CM | POA: Diagnosis not present

## 2013-10-29 DIAGNOSIS — Z79899 Other long term (current) drug therapy: Secondary | ICD-10-CM | POA: Diagnosis not present

## 2013-10-29 DIAGNOSIS — Z808 Family history of malignant neoplasm of other organs or systems: Secondary | ICD-10-CM | POA: Diagnosis not present

## 2013-10-29 DIAGNOSIS — Z8049 Family history of malignant neoplasm of other genital organs: Secondary | ICD-10-CM | POA: Diagnosis not present

## 2013-10-29 DIAGNOSIS — I209 Angina pectoris, unspecified: Secondary | ICD-10-CM | POA: Diagnosis not present

## 2013-10-29 DIAGNOSIS — R9430 Abnormal result of cardiovascular function study, unspecified: Secondary | ICD-10-CM | POA: Diagnosis not present

## 2013-10-29 DIAGNOSIS — Z8042 Family history of malignant neoplasm of prostate: Secondary | ICD-10-CM | POA: Diagnosis not present

## 2013-10-29 DIAGNOSIS — R079 Chest pain, unspecified: Secondary | ICD-10-CM | POA: Diagnosis not present

## 2013-10-29 DIAGNOSIS — E785 Hyperlipidemia, unspecified: Secondary | ICD-10-CM | POA: Diagnosis not present

## 2013-10-29 DIAGNOSIS — I10 Essential (primary) hypertension: Secondary | ICD-10-CM | POA: Diagnosis not present

## 2013-10-29 DIAGNOSIS — Z803 Family history of malignant neoplasm of breast: Secondary | ICD-10-CM | POA: Diagnosis not present

## 2013-10-29 DIAGNOSIS — Z7982 Long term (current) use of aspirin: Secondary | ICD-10-CM | POA: Diagnosis not present

## 2013-11-16 DIAGNOSIS — E782 Mixed hyperlipidemia: Secondary | ICD-10-CM | POA: Diagnosis not present

## 2013-11-16 DIAGNOSIS — I119 Hypertensive heart disease without heart failure: Secondary | ICD-10-CM | POA: Diagnosis not present

## 2013-11-16 DIAGNOSIS — R0602 Shortness of breath: Secondary | ICD-10-CM | POA: Diagnosis not present

## 2013-11-16 DIAGNOSIS — I6529 Occlusion and stenosis of unspecified carotid artery: Secondary | ICD-10-CM | POA: Diagnosis not present

## 2013-11-20 ENCOUNTER — Other Ambulatory Visit: Payer: Self-pay | Admitting: Internal Medicine

## 2013-11-24 DIAGNOSIS — R0602 Shortness of breath: Secondary | ICD-10-CM | POA: Diagnosis not present

## 2013-11-26 ENCOUNTER — Other Ambulatory Visit (INDEPENDENT_AMBULATORY_CARE_PROVIDER_SITE_OTHER): Payer: Medicare Other

## 2013-11-26 DIAGNOSIS — K219 Gastro-esophageal reflux disease without esophagitis: Secondary | ICD-10-CM

## 2013-11-26 DIAGNOSIS — E039 Hypothyroidism, unspecified: Secondary | ICD-10-CM

## 2013-11-26 DIAGNOSIS — M810 Age-related osteoporosis without current pathological fracture: Secondary | ICD-10-CM | POA: Diagnosis not present

## 2013-11-26 DIAGNOSIS — E7800 Pure hypercholesterolemia, unspecified: Secondary | ICD-10-CM | POA: Diagnosis not present

## 2013-11-26 DIAGNOSIS — I10 Essential (primary) hypertension: Secondary | ICD-10-CM | POA: Diagnosis not present

## 2013-11-26 LAB — CBC WITH DIFFERENTIAL/PLATELET
BASOS ABS: 0 10*3/uL (ref 0.0–0.1)
Basophils Relative: 0.4 % (ref 0.0–3.0)
EOS ABS: 0.1 10*3/uL (ref 0.0–0.7)
Eosinophils Relative: 1.6 % (ref 0.0–5.0)
HCT: 41.5 % (ref 36.0–46.0)
HEMOGLOBIN: 14 g/dL (ref 12.0–15.0)
LYMPHS PCT: 29.2 % (ref 12.0–46.0)
Lymphs Abs: 1.5 10*3/uL (ref 0.7–4.0)
MCHC: 33.7 g/dL (ref 30.0–36.0)
MCV: 88.8 fl (ref 78.0–100.0)
MONO ABS: 0.4 10*3/uL (ref 0.1–1.0)
Monocytes Relative: 8 % (ref 3.0–12.0)
Neutro Abs: 3.1 10*3/uL (ref 1.4–7.7)
Neutrophils Relative %: 60.8 % (ref 43.0–77.0)
Platelets: 231 10*3/uL (ref 150.0–400.0)
RBC: 4.67 Mil/uL (ref 3.87–5.11)
RDW: 13.3 % (ref 11.5–14.6)
WBC: 5.1 10*3/uL (ref 4.5–10.5)

## 2013-11-26 LAB — BASIC METABOLIC PANEL
BUN: 20 mg/dL (ref 6–23)
CALCIUM: 9.1 mg/dL (ref 8.4–10.5)
CO2: 28 mEq/L (ref 19–32)
Chloride: 105 mEq/L (ref 96–112)
Creatinine, Ser: 0.7 mg/dL (ref 0.4–1.2)
GFR: 93.5 mL/min (ref >60.00)
Glucose, Bld: 95 mg/dL (ref 70–99)
Potassium: 3.7 mEq/L (ref 3.5–5.1)
Sodium: 140 mEq/L (ref 135–145)

## 2013-11-26 LAB — LIPID PANEL
CHOLESTEROL: 209 mg/dL — AB (ref 0–200)
HDL: 88.5 mg/dL (ref >39.00)
Total CHOL/HDL Ratio: 2
Triglycerides: 78 mg/dL (ref 0.0–149.0)
VLDL: 15.6 mg/dL (ref 0.0–40.0)

## 2013-11-26 LAB — HEPATIC FUNCTION PANEL
ALBUMIN: 4.2 g/dL (ref 3.5–5.2)
ALT: 13 U/L (ref 0–35)
AST: 19 U/L (ref 0–37)
Alkaline Phosphatase: 99 U/L (ref 39–117)
BILIRUBIN DIRECT: 0.1 mg/dL (ref 0.0–0.3)
Total Bilirubin: 0.8 mg/dL (ref 0.3–1.2)
Total Protein: 7.2 g/dL (ref 6.0–8.3)

## 2013-11-26 LAB — TSH: TSH: 2.57 u[IU]/mL (ref 0.35–5.50)

## 2013-11-26 LAB — LDL CHOLESTEROL, DIRECT: Direct LDL: 105.6 mg/dL

## 2013-11-27 LAB — VITAMIN D 25 HYDROXY (VIT D DEFICIENCY, FRACTURES): Vit D, 25-Hydroxy: 22 ng/mL — ABNORMAL LOW (ref 30–89)

## 2013-12-01 ENCOUNTER — Encounter: Payer: Self-pay | Admitting: Internal Medicine

## 2013-12-01 ENCOUNTER — Ambulatory Visit (INDEPENDENT_AMBULATORY_CARE_PROVIDER_SITE_OTHER): Payer: Medicare Other | Admitting: Internal Medicine

## 2013-12-01 VITALS — BP 130/80 | HR 99 | Temp 98.7°F | Ht 63.5 in | Wt 125.0 lb

## 2013-12-01 DIAGNOSIS — G4700 Insomnia, unspecified: Secondary | ICD-10-CM

## 2013-12-01 DIAGNOSIS — I10 Essential (primary) hypertension: Secondary | ICD-10-CM | POA: Diagnosis not present

## 2013-12-01 DIAGNOSIS — E7800 Pure hypercholesterolemia, unspecified: Secondary | ICD-10-CM | POA: Diagnosis not present

## 2013-12-01 DIAGNOSIS — Z23 Encounter for immunization: Secondary | ICD-10-CM

## 2013-12-01 DIAGNOSIS — M810 Age-related osteoporosis without current pathological fracture: Secondary | ICD-10-CM

## 2013-12-01 DIAGNOSIS — K219 Gastro-esophageal reflux disease without esophagitis: Secondary | ICD-10-CM

## 2013-12-01 DIAGNOSIS — E039 Hypothyroidism, unspecified: Secondary | ICD-10-CM

## 2013-12-01 NOTE — Progress Notes (Signed)
Pre-visit discussion using our clinic review tool. No additional management support is needed unless otherwise documented below in the visit note.  

## 2013-12-06 ENCOUNTER — Encounter: Payer: Self-pay | Admitting: Internal Medicine

## 2013-12-06 NOTE — Progress Notes (Signed)
Subjective:    [name], female    DOB: 11-25-1932, 80 y.o.   MRN: [id number]  HPI 80 year old female with past history of hypertension, hypercholesterolemia, hypothyroidism and GERD who comes in today for a scheduled follow up.  States overall she is doing relatively well.  Has been seeing Dr Nehemiah Massed.  Had heart catheterization.  Everything checked out fine (per her report).  Still with some windedness with exertion.  States had PFTs recently.  Waiting for results.  Not using inhalers.  No chest pain.  No acid reflux.  No nausea or vomiting.  No bowel change.  Blood pressure dong well.  Handling stress relatively well.      Past Medical History  Diagnosis Date  . Hypertension   . Hypothyroidism   . Hypercholesterolemia   . GERD (gastroesophageal reflux disease)   . Osteoporosis   . Glaucoma   . Restless legs   . History of migraine headaches   . Cystocele     stress incontinence, pessary  . Vitamin D deficiency   . History of chicken pox     Current Outpatient Prescriptions on File Prior to Visit  Medication Sig Dispense Refill  . albuterol (PROVENTIL HFA;VENTOLIN HFA) 108 (90 BASE) MCG/ACT inhaler Inhale 2 puffs into the lungs every 6 (six) hours as needed for wheezing.  1 Inhaler  1  . aspirin 81 MG tablet Take 81 mg by mouth daily.      . Calcium Carbonate-Vit D-Min 600-400 MG-UNIT TABS Take by mouth 2 (two) times daily. Take 1 tablet by mouth twice a day      . carbidopa-levodopa (SINEMET IR) 10-100 MG per tablet TAKE 1 TABLET BY MOUTH AT BEDTIME  90 tablet  1  . Cholecalciferol (VITAMIN D3) 2000 UNITS capsule Take 2,000 Units by mouth daily.      Marland Kitchen levothyroxine (SYNTHROID, LEVOTHROID) 75 MCG tablet TAKE 1 TABLET BY MOUTH EVERY DAY  90 tablet  0  . pantoprazole (PROTONIX) 40 MG tablet TAKE 1 TABLET BY MOUTH EVERY DAY  90 tablet  1  . pravastatin (PRAVACHOL) 10 MG tablet Take 1 tablet (10 mg total) by mouth daily.  90 tablet  3  . sertraline (ZOLOFT) 50 MG  tablet Take 1 tablet (50 mg total) by mouth daily.  30 tablet  2  . telmisartan (MICARDIS) 80 MG tablet Take 1 tablet (80 mg total) by mouth daily.  90 tablet  3  . traZODone (DESYREL) 50 MG tablet TAKE 1/2 TO 1 TABLET BY MOUTH AT BEDTIME AS NEEDED SLEEP  30 tablet  2  . amLODipine (NORVASC) 5 MG tablet TAKE 1 TABLET BY MOUTH EVERY DAY  90 tablet  1   No current facility-administered medications on file prior to visit.    Review of Systems Patient denies any headache, lightheadedness or dizziness.  No significant sinus symptoms.  No chest pain, tightness or palpitations now.  No increased shortness of breath, cough or congestion.  Breathing stable. Still gets winded with exertion.   No nausea or vomiting.  No acid reflux.  No abdominal pain or cramping.  No bowel change, such as diarrhea, constipation, BRBPR or melana.  No urine change.  Eating and drinking well.  Takes trazodone to help her sleep.  Handling stress well.         Objective:   Physical Exam  Filed Vitals:   12/01/13 1359  BP: 130/80  Pulse: 99  Temp: 98.7 F (37.1 C)   Blood  pressure recheck:  78/19  80 year old female in no acute distress.   HEENT:  Nares- clear.  Oropharynx - without lesions. NECK:  Supple.  Nontender.  No audible bruit.  HEART:  Appears to be regular. LUNGS:  No crackles or wheezing audible.  Respirations even and unlabored.  RADIAL PULSE:  Equal bilaterally.   ABDOMEN:  Soft, nontender.  Bowel sounds present and normal.  No audible abdominal bruit.    EXTREMITIES:  No increased edema present.  DP pulses palpable and equal bilaterally.           Assessment & Plan:  GI.  Has had no further bleeding.  Colonoscopy 10/11 revealed congested mucosa and internal hemorrhoids.  On Protonix.  Asymptomatic.    CARDIOVASCULAR.  Sees Dr Nehemiah Massed.  Had recent heart cath - ok per her report.  Continue risk factor modification.    PULMONARY.  Windedness with exertion.   Just had PFTs.  Await results.      RIGHT CAROTID BRUIT.  Carotid ultrasound 11/17/10 revealed no hemodynamically significant stenosis.  Continue daily aspirin.    HEALTH MAINTENANCE.  Physical 03/05/13.   Mammogram 01/14/12 recommended follow up views.  Follow up views 01/16/12 - BiRADS II.   Mammogram 04/01/13 recommended follow up views.  Follow up mammogram 04/02/13 - Birads II.   Colonoscopy as outlined.

## 2013-12-06 NOTE — Assessment & Plan Note (Signed)
Low cholesterol diet and exercise.  Follow lipid panel.  Cholesterol just checked and revealed total cholesterol 209, triglycerides787, HDL 89 and LDL 106.  Continue current med regimen.

## 2013-12-06 NOTE — Assessment & Plan Note (Signed)
On synthroid.  Follow tsh.   

## 2013-12-06 NOTE — Assessment & Plan Note (Signed)
On Protonix.  Asymptomatic.    

## 2013-12-06 NOTE — Assessment & Plan Note (Signed)
On trazodone and tolerating.  Helping.  Follow.

## 2013-12-06 NOTE — Assessment & Plan Note (Signed)
Follow vitamin D level.  Continue vitamin D supplementation.   

## 2013-12-06 NOTE — Assessment & Plan Note (Signed)
Blood pressure as outlined.  Same meds.  Follow metabolic panel.

## 2013-12-11 ENCOUNTER — Other Ambulatory Visit: Payer: Self-pay | Admitting: Internal Medicine

## 2014-01-17 ENCOUNTER — Other Ambulatory Visit: Payer: Self-pay | Admitting: Internal Medicine

## 2014-01-28 DIAGNOSIS — I119 Hypertensive heart disease without heart failure: Secondary | ICD-10-CM | POA: Diagnosis not present

## 2014-01-28 DIAGNOSIS — K3189 Other diseases of stomach and duodenum: Secondary | ICD-10-CM | POA: Diagnosis not present

## 2014-01-28 DIAGNOSIS — R1013 Epigastric pain: Secondary | ICD-10-CM | POA: Diagnosis not present

## 2014-01-28 DIAGNOSIS — E782 Mixed hyperlipidemia: Secondary | ICD-10-CM | POA: Diagnosis not present

## 2014-01-28 DIAGNOSIS — R0602 Shortness of breath: Secondary | ICD-10-CM | POA: Diagnosis not present

## 2014-02-19 ENCOUNTER — Other Ambulatory Visit: Payer: Self-pay | Admitting: Internal Medicine

## 2014-03-08 ENCOUNTER — Encounter: Payer: Medicare Other | Admitting: Internal Medicine

## 2014-03-09 ENCOUNTER — Other Ambulatory Visit (INDEPENDENT_AMBULATORY_CARE_PROVIDER_SITE_OTHER): Payer: Medicare Other

## 2014-03-09 DIAGNOSIS — I10 Essential (primary) hypertension: Secondary | ICD-10-CM

## 2014-03-09 DIAGNOSIS — E7800 Pure hypercholesterolemia, unspecified: Secondary | ICD-10-CM

## 2014-03-09 LAB — BASIC METABOLIC PANEL
BUN: 17 mg/dL (ref 6–23)
CHLORIDE: 104 meq/L (ref 96–112)
CO2: 29 meq/L (ref 19–32)
CREATININE: 0.7 mg/dL (ref 0.4–1.2)
Calcium: 9.2 mg/dL (ref 8.4–10.5)
GFR: 93.43 mL/min (ref >60.00)
Glucose, Bld: 94 mg/dL (ref 70–99)
Potassium: 4.1 mEq/L (ref 3.5–5.1)
Sodium: 138 mEq/L (ref 135–145)

## 2014-03-09 LAB — HEPATIC FUNCTION PANEL
ALT: 9 U/L (ref 0–35)
AST: 21 U/L (ref 0–37)
Albumin: 4 g/dL (ref 3.5–5.2)
Alkaline Phosphatase: 92 U/L (ref 39–117)
Bilirubin, Direct: 0 mg/dL (ref 0.0–0.3)
TOTAL PROTEIN: 6.8 g/dL (ref 6.0–8.3)
Total Bilirubin: 0.5 mg/dL (ref 0.3–1.2)

## 2014-03-09 LAB — LIPID PANEL
CHOLESTEROL: 213 mg/dL — AB (ref 0–200)
HDL: 76.2 mg/dL (ref >39.00)
LDL Cholesterol: 124 mg/dL — ABNORMAL HIGH (ref 0–99)
TRIGLYCERIDES: 65 mg/dL (ref 0.0–149.0)
Total CHOL/HDL Ratio: 3
VLDL: 13 mg/dL (ref 0.0–40.0)

## 2014-03-11 ENCOUNTER — Ambulatory Visit (INDEPENDENT_AMBULATORY_CARE_PROVIDER_SITE_OTHER): Payer: Medicare Other | Admitting: Internal Medicine

## 2014-03-11 ENCOUNTER — Encounter: Payer: Self-pay | Admitting: Internal Medicine

## 2014-03-11 VITALS — BP 110/70 | HR 96 | Temp 98.1°F | Ht 63.0 in | Wt 123.2 lb

## 2014-03-11 DIAGNOSIS — M810 Age-related osteoporosis without current pathological fracture: Secondary | ICD-10-CM

## 2014-03-11 DIAGNOSIS — R55 Syncope and collapse: Secondary | ICD-10-CM

## 2014-03-11 DIAGNOSIS — I10 Essential (primary) hypertension: Secondary | ICD-10-CM

## 2014-03-11 DIAGNOSIS — G4700 Insomnia, unspecified: Secondary | ICD-10-CM

## 2014-03-11 DIAGNOSIS — E7800 Pure hypercholesterolemia, unspecified: Secondary | ICD-10-CM | POA: Diagnosis not present

## 2014-03-11 DIAGNOSIS — K219 Gastro-esophageal reflux disease without esophagitis: Secondary | ICD-10-CM | POA: Diagnosis not present

## 2014-03-11 DIAGNOSIS — Z1239 Encounter for other screening for malignant neoplasm of breast: Secondary | ICD-10-CM | POA: Diagnosis not present

## 2014-03-11 DIAGNOSIS — E039 Hypothyroidism, unspecified: Secondary | ICD-10-CM

## 2014-03-11 NOTE — Progress Notes (Signed)
Subjective:    [name], female    DOB: 12-16-1932, 80 y.o.   MRN: [id number]  HPI 80 year old female with past history of hypertension, hypercholesterolemia, hypothyroidism and GERD who comes in today to follow up on these issues as well as for a complete physical exam.  States overall she is doing relatively well.  Has been seeing Dr Nehemiah Massed.  Had heart catheterization.  Everything checked out fine (per her report).  Just evaluated and amlodipine was decreased to 2.5mg  q day.   No chest pain.  No acid reflux.  No nausea or vomiting.  No bowel change.  Blood pressure dong well.  Handling stress relatively well.  She feels her breathing is better.  States that two weeks ago, she had had a stressful day.  She had just finished eating.  Was sitting at the table and started feeling funny.  Passed out.  Came to and her family had her lying on the bed.  She was not confused.  The episode was very brief.  She states she has had previous episodes like this for years.  Intermittent.  She saw Dr Morrell Riddle a neurologist after one episode when she was pregnant.  They placed her on phenobarb for a while.  Unclear as to the exact etiology.  She states has never had seizures.  We discussed this at length today.  She denies any chest pain or tightness.  No increased heart rate or palpitations associated with the episode.  She has not had any episodes since.  States she can always tell when they are coming on.  Are rare in occurrence.  She feels this was related to the increased stress.  Has never affected her driving.  Always has warning.      Past Medical History  Diagnosis Date  . Hypertension   . Hypothyroidism   . Hypercholesterolemia   . GERD (gastroesophageal reflux disease)   . Osteoporosis   . Glaucoma   . Restless legs   . History of migraine headaches   . Cystocele     stress incontinence, pessary  . Vitamin D deficiency   . History of chicken pox     Current Outpatient  Prescriptions on File Prior to Visit  Medication Sig Dispense Refill  . amLODipine (NORVASC) 5 MG tablet TAKE 1 TABLET BY MOUTH EVERY DAY  90 tablet  1  . aspirin 81 MG tablet Take 81 mg by mouth daily.      . Calcium Carbonate-Vit D-Min 600-400 MG-UNIT TABS Take by mouth 2 (two) times daily. Take 1 tablet by mouth twice a day      . carbidopa-levodopa (SINEMET IR) 10-100 MG per tablet TAKE 1 TABLET BY MOUTH AT BEDTIME  90 tablet  1  . Cholecalciferol (VITAMIN D3) 2000 UNITS capsule Take 2,000 Units by mouth daily.      Marland Kitchen levothyroxine (SYNTHROID, LEVOTHROID) 75 MCG tablet TAKE 1 TABLET BY MOUTH EVERY DAY  90 tablet  3  . pantoprazole (PROTONIX) 40 MG tablet TAKE 1 TABLET BY MOUTH EVERY DAY  90 tablet  1  . pravastatin (PRAVACHOL) 10 MG tablet Take 1 tablet (10 mg total) by mouth daily.  90 tablet  3  . sertraline (ZOLOFT) 50 MG tablet Take 1 tablet (50 mg total) by mouth daily.  30 tablet  2  . telmisartan (MICARDIS) 80 MG tablet Take 1 tablet (80 mg total) by mouth daily.  90 tablet  3  . traZODone (DESYREL) 50 MG tablet  TAKE 1/2 TO 1 TABLET BY MOUTH AT BEDTIME AS NEEDED FOR SLEEP  30 tablet  2  . albuterol (PROVENTIL HFA;VENTOLIN HFA) 108 (90 BASE) MCG/ACT inhaler Inhale 2 puffs into the lungs every 6 (six) hours as needed for wheezing.  1 Inhaler  1   No current facility-administered medications on file prior to visit.    Review of Systems Patient denies any headache, lightheadedness or dizziness.  No significant sinus symptoms.  No chest pain, tightness or palpitations now.  No increased shortness of breath, cough or congestion.  Breathing stable.  No nausea or vomiting.  No acid reflux.  No abdominal pain or cramping.  No bowel change, such as diarrhea, constipation, BRBPR or melana.  No urine change.  Eating and drinking well.  Takes trazodone to help her sleep.        Objective:   Physical Exam  Filed Vitals:   03/11/14 1333  BP: 110/70  Pulse: 96  Temp: 98.1 F (36.7 C)    Blood pressure recheck:  54/29  80 year old female in no acute distress.   HEENT:  Nares- clear.  Oropharynx - without lesions. NECK:  Supple.  Nontender.  No audible bruit.  HEART:  Appears to be regular. LUNGS:  No crackles or wheezing audible.  Respirations even and unlabored.  RADIAL PULSE:  Equal bilaterally.    BREASTS:  No nipple discharge or nipple retraction present.  Could not appreciate any distinct nodules or axillary adenopathy.  ABDOMEN:  Soft, nontender.  Bowel sounds present and normal.  No audible abdominal bruit.  GU:  Not performed.     EXTREMITIES:  No increased edema present.  DP pulses palpable and equal bilaterally.          Assessment & Plan:  GI.  Has had no further bleeding.  Colonoscopy 10/11 revealed congested mucosa and internal hemorrhoids.  On Protonix.  Asymptomatic.    CARDIOVASCULAR.  Sees Dr Nehemiah Massed.  Had recent heart cath - ok per her report.  Continue risk factor modification.    PULMONARY.  Breathing stable.       RIGHT CAROTID BRUIT.  Carotid ultrasound 11/17/10 revealed no hemodynamically significant stenosis.  Continue daily aspirin.    HEALTH MAINTENANCE.  Physical today.   Mammogram 01/14/12 recommended follow up views.  Follow up views 01/16/12 - BiRADS II.   Mammogram 04/01/13 recommended follow up views.  Follow up mammogram 04/02/13 - Birads II.  Schedule f/u mammogram when due.  Colonoscopy as outlined.

## 2014-03-11 NOTE — Progress Notes (Signed)
Pre visit review using our clinic review tool, if applicable. No additional management support is needed unless otherwise documented below in the visit note. 

## 2014-03-14 ENCOUNTER — Encounter: Payer: Self-pay | Admitting: Internal Medicine

## 2014-03-14 DIAGNOSIS — R55 Syncope and collapse: Secondary | ICD-10-CM | POA: Insufficient documentation

## 2014-03-14 NOTE — Assessment & Plan Note (Signed)
Had the episode as outlined.  Discussed with her at length today.  Discussed further w/up and evaluation.  She declines.  We discussed driving.   She states she has no problems driving.  Has had the episodes for years.  Rarely occurs.  Always can tell when coming on.  Discussed need to know etiology.  She declines further w/up.  Follow.

## 2014-03-14 NOTE — Assessment & Plan Note (Signed)
On Protonix.  Asymptomatic.    

## 2014-03-14 NOTE — Assessment & Plan Note (Signed)
On trazodone and tolerating.  Helping.  Follow.

## 2014-03-14 NOTE — Assessment & Plan Note (Signed)
On synthroid.  Follow tsh.   

## 2014-03-14 NOTE — Assessment & Plan Note (Signed)
Blood pressure as outlined.  Same meds.  Follow metabolic panel.

## 2014-03-14 NOTE — Assessment & Plan Note (Signed)
Follow vitamin D level.  Continue vitamin D supplementation.   

## 2014-03-14 NOTE — Assessment & Plan Note (Signed)
Low cholesterol diet and exercise.  Follow lipid panel.   Continue current med regimen.

## 2014-03-19 ENCOUNTER — Encounter: Payer: Self-pay | Admitting: *Deleted

## 2014-04-13 ENCOUNTER — Other Ambulatory Visit: Payer: Self-pay | Admitting: Internal Medicine

## 2014-04-15 ENCOUNTER — Ambulatory Visit: Payer: Self-pay | Admitting: Internal Medicine

## 2014-04-15 DIAGNOSIS — Z1231 Encounter for screening mammogram for malignant neoplasm of breast: Secondary | ICD-10-CM | POA: Diagnosis not present

## 2014-04-15 LAB — HM MAMMOGRAPHY: HM Mammogram: NEGATIVE

## 2014-04-16 ENCOUNTER — Encounter: Payer: Self-pay | Admitting: Internal Medicine

## 2014-05-14 ENCOUNTER — Other Ambulatory Visit: Payer: Self-pay | Admitting: Internal Medicine

## 2014-05-29 ENCOUNTER — Other Ambulatory Visit: Payer: Self-pay | Admitting: Internal Medicine

## 2014-06-15 DIAGNOSIS — I10 Essential (primary) hypertension: Secondary | ICD-10-CM | POA: Diagnosis not present

## 2014-06-15 DIAGNOSIS — E785 Hyperlipidemia, unspecified: Secondary | ICD-10-CM | POA: Diagnosis not present

## 2014-06-15 DIAGNOSIS — J449 Chronic obstructive pulmonary disease, unspecified: Secondary | ICD-10-CM | POA: Diagnosis not present

## 2014-06-15 DIAGNOSIS — I739 Peripheral vascular disease, unspecified: Secondary | ICD-10-CM | POA: Diagnosis not present

## 2014-06-28 ENCOUNTER — Other Ambulatory Visit: Payer: Self-pay | Admitting: Internal Medicine

## 2014-07-09 ENCOUNTER — Other Ambulatory Visit (INDEPENDENT_AMBULATORY_CARE_PROVIDER_SITE_OTHER): Payer: Medicare Other

## 2014-07-09 DIAGNOSIS — E7800 Pure hypercholesterolemia, unspecified: Secondary | ICD-10-CM | POA: Diagnosis not present

## 2014-07-09 DIAGNOSIS — I10 Essential (primary) hypertension: Secondary | ICD-10-CM | POA: Diagnosis not present

## 2014-07-09 LAB — BASIC METABOLIC PANEL
BUN: 16 mg/dL (ref 6–23)
CO2: 26 mEq/L (ref 19–32)
Calcium: 9.1 mg/dL (ref 8.4–10.5)
Chloride: 104 mEq/L (ref 96–112)
Creatinine, Ser: 0.8 mg/dL (ref 0.4–1.2)
GFR: 74.53 mL/min (ref >60.00)
Glucose, Bld: 95 mg/dL (ref 70–99)
POTASSIUM: 4.4 meq/L (ref 3.5–5.1)
SODIUM: 140 meq/L (ref 135–145)

## 2014-07-09 LAB — LIPID PANEL
CHOLESTEROL: 251 mg/dL — AB (ref 0–200)
HDL: 81 mg/dL (ref >39.00)
LDL CALC: 151 mg/dL — AB (ref 0–99)
NonHDL: 170
TRIGLYCERIDES: 94 mg/dL (ref 0.0–149.0)
Total CHOL/HDL Ratio: 3
VLDL: 18.8 mg/dL (ref 0.0–40.0)

## 2014-07-09 LAB — HEPATIC FUNCTION PANEL
ALT: 12 U/L (ref 0–35)
AST: 26 U/L (ref 0–37)
Albumin: 4.1 g/dL (ref 3.5–5.2)
Alkaline Phosphatase: 96 U/L (ref 39–117)
Bilirubin, Direct: 0.2 mg/dL (ref 0.0–0.3)
TOTAL PROTEIN: 7.6 g/dL (ref 6.0–8.3)
Total Bilirubin: 1.1 mg/dL (ref 0.2–1.2)

## 2014-07-15 ENCOUNTER — Encounter: Payer: Self-pay | Admitting: Internal Medicine

## 2014-07-15 ENCOUNTER — Ambulatory Visit (INDEPENDENT_AMBULATORY_CARE_PROVIDER_SITE_OTHER): Payer: Medicare Other | Admitting: Internal Medicine

## 2014-07-15 VITALS — BP 130/70 | HR 75 | Temp 98.1°F | Ht 63.0 in | Wt 123.8 lb

## 2014-07-15 DIAGNOSIS — E039 Hypothyroidism, unspecified: Secondary | ICD-10-CM

## 2014-07-15 DIAGNOSIS — R55 Syncope and collapse: Secondary | ICD-10-CM

## 2014-07-15 DIAGNOSIS — E7800 Pure hypercholesterolemia, unspecified: Secondary | ICD-10-CM | POA: Diagnosis not present

## 2014-07-15 DIAGNOSIS — K219 Gastro-esophageal reflux disease without esophagitis: Secondary | ICD-10-CM

## 2014-07-15 DIAGNOSIS — M79609 Pain in unspecified limb: Secondary | ICD-10-CM

## 2014-07-15 DIAGNOSIS — I10 Essential (primary) hypertension: Secondary | ICD-10-CM

## 2014-07-15 DIAGNOSIS — Z23 Encounter for immunization: Secondary | ICD-10-CM | POA: Diagnosis not present

## 2014-07-15 DIAGNOSIS — M79645 Pain in left finger(s): Secondary | ICD-10-CM

## 2014-07-15 DIAGNOSIS — M79644 Pain in right finger(s): Secondary | ICD-10-CM

## 2014-07-15 DIAGNOSIS — M810 Age-related osteoporosis without current pathological fracture: Secondary | ICD-10-CM

## 2014-07-15 LAB — SEDIMENTATION RATE: Sed Rate: 6 mm/hr (ref 0–22)

## 2014-07-15 LAB — C-REACTIVE PROTEIN: CRP: <0.5 mg/dL (ref 0.5–20.0)

## 2014-07-15 NOTE — Progress Notes (Signed)
Subjective:    [name], female    DOB: October 01, 1933, 80 y.o.   MRN: [id number]  HPI 80 year old female with past history of hypertension, hypercholesterolemia, hypothyroidism and GERD who comes in today for a scheduled follow up. States overall she is doing relatively well.  Has been seeing Dr Nehemiah Massed.  Had heart catheterization.  Everything checked out fine.   No chest pain.  No acid reflux.  No nausea or vomiting.  No bowel change.  Blood pressure dong well.  States most systolic readings averaging 120s.  Handling stress relatively well.  She feels her breathing is stable.  No increased heart rate or palpitations.  No syncope or near syncope.  Main complaint is that of increased pain at the base of her thumbs and extends to her wrists.  Right is worse.  Affecting her being able to pick up objects, etc.  Some weakness.  Hard for her to make a fist.  Increased pain.      Past Medical History  Diagnosis Date  . Hypertension   . Hypothyroidism   . Hypercholesterolemia   . GERD (gastroesophageal reflux disease)   . Osteoporosis   . Glaucoma   . Restless legs   . History of migraine headaches   . Cystocele     stress incontinence, pessary  . Vitamin D deficiency   . History of chicken pox     Current Outpatient Prescriptions on File Prior to Visit  Medication Sig Dispense Refill  . albuterol (PROVENTIL HFA;VENTOLIN HFA) 108 (90 BASE) MCG/ACT inhaler Inhale 2 puffs into the lungs every 6 (six) hours as needed for wheezing.  1 Inhaler  1  . amLODipine (NORVASC) 5 MG tablet TAKE 1 TABLET BY MOUTH EVERY DAY  90 tablet  1  . aspirin 81 MG tablet Take 81 mg by mouth daily.      . Calcium Carbonate-Vit D-Min 600-400 MG-UNIT TABS Take by mouth 2 (two) times daily. Take 1 tablet by mouth twice a day      . carbidopa-levodopa (SINEMET IR) 10-100 MG per tablet TAKE 1 TABLET BY MOUTH AT BEDTIME  90 tablet  1  . Cholecalciferol (VITAMIN D3) 2000 UNITS capsule Take 2,000 Units by  mouth daily.      Marland Kitchen levothyroxine (SYNTHROID, LEVOTHROID) 75 MCG tablet TAKE 1 TABLET BY MOUTH EVERY DAY  90 tablet  3  . pantoprazole (PROTONIX) 40 MG tablet TAKE 1 TABLET BY MOUTH EVERY DAY  90 tablet  1  . pravastatin (PRAVACHOL) 10 MG tablet Take 1 tablet (10 mg total) by mouth daily.  90 tablet  3  . sertraline (ZOLOFT) 50 MG tablet Take 1 tablet (50 mg total) by mouth daily.  30 tablet  2  . telmisartan (MICARDIS) 80 MG tablet TAKE 1 TABLET BY MOUTH ONCE A DAY  90 tablet  1  . traZODone (DESYREL) 50 MG tablet TAKE 1/2 TO 1 TABLET BY MOUTH AT BEDTIME AS NEEDED FOR SLEEP  30 tablet  2   No current facility-administered medications on file prior to visit.    Review of Systems Patient denies any headache, lightheadedness or dizziness.  No significant sinus symptoms.  No chest pain, tightness or palpitations now.  No increased shortness of breath, cough or congestion.  Breathing stable.  No nausea or vomiting.  No acid reflux.  No abdominal pain or cramping.  No bowel change, such as diarrhea, constipation, BRBPR or melana.  No urine change.  Eating and drinking well.  Takes trazodone to help her sleep.  Increased thumb and wrist pain as outlined.  No increased redness or warmth.       Objective:   Physical Exam  Filed Vitals:   07/15/14 1000  BP: 130/70  Pulse: 75  Temp: 98.1 F (84.86 C)   80 year old female in no acute distress.   HEENT:  Nares- clear.  Oropharynx - without lesions. NECK:  Supple.  Nontender.  No audible bruit.  HEART:  Appears to be regular. LUNGS:  No crackles or wheezing audible.  Respirations even and unlabored.  RADIAL PULSE:  Equal bilaterally.  ABDOMEN:  Soft, nontender.  Bowel sounds present and normal.  No audible abdominal bruit.   EXTREMITIES:  No increased edema present.  DP pulses palpable and equal bilaterally.   MSK:  Increased pain to palpation over the base of both thumbs and extending up her wrists (right > left).  No increased erythema or  warmth.          Assessment & Plan:  GI.  Has had no further bleeding.  Colonoscopy 10/11 revealed congested mucosa and internal hemorrhoids.  On Protonix.  Asymptomatic.    CARDIOVASCULAR.  Sees Dr Nehemiah Massed.  Had recent heart cath - ok per her report.  Continue risk factor modification.    PULMONARY.  Breathing stable.       RIGHT CAROTID BRUIT.  Carotid ultrasound 11/17/10 revealed no hemodynamically significant stenosis.  Continue daily aspirin.  Currently asymptomatic.    HEALTH MAINTENANCE.  Physical 03/11/14..    Mammogram 04/15/14 - Birads I.   Colonoscopy as outlined.    I spent 25 minutes with the patient and more than 50% of the time was spent in consultation regarding the above.

## 2014-07-15 NOTE — Progress Notes (Signed)
Pre visit review using our clinic review tool, if applicable. No additional management support is needed unless otherwise documented below in the visit note. 

## 2014-07-16 LAB — RHEUMATOID FACTOR: Rhuematoid fact SerPl-aCnc: <10 IU/mL (ref <=14)

## 2014-07-16 LAB — ANA: Anti Nuclear Antibody(ANA): NEGATIVE

## 2014-07-19 ENCOUNTER — Encounter: Payer: Self-pay | Admitting: Internal Medicine

## 2014-07-19 MED ORDER — PRAVASTATIN SODIUM 20 MG PO TABS: 20.0000 mg | ORAL_TABLET | Freq: Every day | ORAL | Status: DC

## 2014-07-19 NOTE — Assessment & Plan Note (Signed)
On synthroid.  Follow tsh.   

## 2014-07-19 NOTE — Assessment & Plan Note (Signed)
On Protonix.  Asymptomatic.

## 2014-07-19 NOTE — Assessment & Plan Note (Signed)
Follow vitamin D level.  Continue vitamin D supplementation.

## 2014-07-19 NOTE — Assessment & Plan Note (Addendum)
Low cholesterol diet and exercise.  Follow lipid panel.   LDL just checked and revealed LDL elevated - 151.  Will increase pravastatin to 20mg  q day.  Follow.

## 2014-07-19 NOTE — Assessment & Plan Note (Signed)
Blood pressure as outlined.  Same meds.  Follow metabolic panel.

## 2014-07-19 NOTE — Assessment & Plan Note (Signed)
Persistent pain.  Right > left.  Will try thumb spica splint.  Will refer to Dr Jefm Bryant for further evaluation and treatment.  Question if would benefit from injection.  Hold on xray.

## 2014-07-19 NOTE — Assessment & Plan Note (Signed)
No further syncope or near syncopal episodes.  Feels good.  Stays active.

## 2014-08-03 DIAGNOSIS — M65839 Other synovitis and tenosynovitis, unspecified forearm: Secondary | ICD-10-CM | POA: Diagnosis not present

## 2014-08-03 DIAGNOSIS — M65849 Other synovitis and tenosynovitis, unspecified hand: Secondary | ICD-10-CM | POA: Diagnosis not present

## 2014-08-19 ENCOUNTER — Other Ambulatory Visit: Payer: Self-pay | Admitting: Internal Medicine

## 2014-08-20 ENCOUNTER — Observation Stay: Payer: Self-pay | Admitting: Surgery

## 2014-08-20 DIAGNOSIS — S2220XA Unspecified fracture of sternum, initial encounter for closed fracture: Secondary | ICD-10-CM | POA: Diagnosis not present

## 2014-08-20 LAB — COMPREHENSIVE METABOLIC PANEL
ANION GAP: 8 (ref 7–16)
AST: 30 U/L (ref 15–37)
Albumin: 3.7 g/dL (ref 3.4–5.0)
Alkaline Phosphatase: 108 U/L
BILIRUBIN TOTAL: 0.7 mg/dL (ref 0.2–1.0)
BUN: 24 mg/dL — AB (ref 7–18)
CHLORIDE: 107 mmol/L (ref 98–107)
CO2: 26 mmol/L (ref 21–32)
Calcium, Total: 8.1 mg/dL — ABNORMAL LOW (ref 8.5–10.1)
Creatinine: 0.63 mg/dL (ref 0.60–1.30)
EGFR (African American): >60
GLUCOSE: 97 mg/dL (ref 65–99)
OSMOLALITY: 285 (ref 275–301)
Potassium: 3.8 mmol/L (ref 3.5–5.1)
SGPT (ALT): 19 U/L
Sodium: 141 mmol/L (ref 136–145)
Total Protein: 7 g/dL (ref 6.4–8.2)

## 2014-08-20 LAB — CBC
HCT: 40.1 % (ref 35.0–47.0)
HGB: 13.1 g/dL (ref 12.0–16.0)
MCH: 29.9 pg (ref 26.0–34.0)
MCHC: 32.6 g/dL (ref 32.0–36.0)
MCV: 92 fL (ref 80–100)
PLATELETS: 169 10*3/uL (ref 150–440)
RBC: 4.39 10*6/uL (ref 3.80–5.20)
RDW: 13.5 % (ref 11.5–14.5)
WBC: 7.2 10*3/uL (ref 3.6–11.0)

## 2014-08-20 LAB — TROPONIN I: Troponin-I: <0.02 ng/mL

## 2014-08-21 DIAGNOSIS — S2220XA Unspecified fracture of sternum, initial encounter for closed fracture: Secondary | ICD-10-CM | POA: Diagnosis not present

## 2014-08-21 LAB — COMPREHENSIVE METABOLIC PANEL
ALK PHOS: 100 U/L
AST: 23 U/L (ref 15–37)
Albumin: 3.2 g/dL — ABNORMAL LOW (ref 3.4–5.0)
Anion Gap: 5 — ABNORMAL LOW (ref 7–16)
BUN: 17 mg/dL (ref 7–18)
Bilirubin,Total: 0.6 mg/dL (ref 0.2–1.0)
CALCIUM: 8.1 mg/dL — AB (ref 8.5–10.1)
CHLORIDE: 106 mmol/L (ref 98–107)
Co2: 29 mmol/L (ref 21–32)
Creatinine: 0.71 mg/dL (ref 0.60–1.30)
EGFR (African American): >60
EGFR (Non-African Amer.): >60
Glucose: 94 mg/dL (ref 65–99)
Osmolality: 281 (ref 275–301)
Potassium: 3.6 mmol/L (ref 3.5–5.1)
SGPT (ALT): 11 U/L — ABNORMAL LOW
Sodium: 140 mmol/L (ref 136–145)
TOTAL PROTEIN: 6.2 g/dL — AB (ref 6.4–8.2)

## 2014-08-21 LAB — PROTIME-INR
INR: 1
Prothrombin Time: 13 secs (ref 11.5–14.7)

## 2014-08-21 LAB — CBC WITH DIFFERENTIAL/PLATELET
BASOS ABS: 0 10*3/uL (ref 0.0–0.1)
Basophil %: 0.6 %
EOS PCT: 1.2 %
Eosinophil #: 0.1 10*3/uL (ref 0.0–0.7)
HCT: 37.8 % (ref 35.0–47.0)
HGB: 12.4 g/dL (ref 12.0–16.0)
Lymphocyte #: 1.4 10*3/uL (ref 1.0–3.6)
Lymphocyte %: 24.6 %
MCH: 29.9 pg (ref 26.0–34.0)
MCHC: 32.9 g/dL (ref 32.0–36.0)
MCV: 91 fL (ref 80–100)
MONOS PCT: 9.9 %
Monocyte #: 0.6 x10 3/mm (ref 0.2–0.9)
NEUTROS PCT: 63.7 %
Neutrophil #: 3.8 10*3/uL (ref 1.4–6.5)
PLATELETS: 206 10*3/uL (ref 150–440)
RBC: 4.16 10*6/uL (ref 3.80–5.20)
RDW: 13.5 % (ref 11.5–14.5)
WBC: 5.9 10*3/uL (ref 3.6–11.0)

## 2014-08-21 LAB — MAGNESIUM: MAGNESIUM: 2 mg/dL

## 2014-08-21 LAB — APTT: Activated PTT: 32.2 secs (ref 23.6–35.9)

## 2014-08-21 LAB — AMYLASE: AMYLASE: 58 U/L (ref 25–115)

## 2014-08-22 DIAGNOSIS — S2220XA Unspecified fracture of sternum, initial encounter for closed fracture: Secondary | ICD-10-CM | POA: Diagnosis not present

## 2014-09-06 DIAGNOSIS — S2220XA Unspecified fracture of sternum, initial encounter for closed fracture: Secondary | ICD-10-CM | POA: Diagnosis not present

## 2014-09-28 ENCOUNTER — Other Ambulatory Visit: Payer: Self-pay | Admitting: Internal Medicine

## 2014-11-10 ENCOUNTER — Other Ambulatory Visit: Payer: Self-pay | Admitting: Internal Medicine

## 2014-11-15 ENCOUNTER — Other Ambulatory Visit (INDEPENDENT_AMBULATORY_CARE_PROVIDER_SITE_OTHER): Payer: Medicare Other

## 2014-11-15 DIAGNOSIS — E7800 Pure hypercholesterolemia, unspecified: Secondary | ICD-10-CM

## 2014-11-15 DIAGNOSIS — I10 Essential (primary) hypertension: Secondary | ICD-10-CM

## 2014-11-15 DIAGNOSIS — R55 Syncope and collapse: Secondary | ICD-10-CM | POA: Diagnosis not present

## 2014-11-15 DIAGNOSIS — E039 Hypothyroidism, unspecified: Secondary | ICD-10-CM

## 2014-11-15 DIAGNOSIS — M810 Age-related osteoporosis without current pathological fracture: Secondary | ICD-10-CM | POA: Diagnosis not present

## 2014-11-15 LAB — CBC WITH DIFFERENTIAL/PLATELET
Basophils Absolute: 0 10*3/uL (ref 0.0–0.1)
Basophils Relative: 0.5 % (ref 0.0–3.0)
Eosinophils Absolute: 0.1 10*3/uL (ref 0.0–0.7)
Eosinophils Relative: 1.7 % (ref 0.0–5.0)
HEMATOCRIT: 40.5 % (ref 36.0–46.0)
HEMOGLOBIN: 13.3 g/dL (ref 12.0–15.0)
LYMPHS ABS: 1.7 10*3/uL (ref 0.7–4.0)
Lymphocytes Relative: 33.4 % (ref 12.0–46.0)
MCHC: 32.9 g/dL (ref 30.0–36.0)
MCV: 90.2 fl (ref 78.0–100.0)
MONO ABS: 0.4 10*3/uL (ref 0.1–1.0)
MONOS PCT: 8.6 % (ref 3.0–12.0)
NEUTROS ABS: 2.9 10*3/uL (ref 1.4–7.7)
Neutrophils Relative %: 55.8 % (ref 43.0–77.0)
PLATELETS: 269 10*3/uL (ref 150.0–400.0)
RBC: 4.49 Mil/uL (ref 3.87–5.11)
RDW: 13.5 % (ref 11.5–15.5)
WBC: 5.1 10*3/uL (ref 4.0–10.5)

## 2014-11-15 LAB — BASIC METABOLIC PANEL
BUN: 27 mg/dL — ABNORMAL HIGH (ref 6–23)
CALCIUM: 9 mg/dL (ref 8.4–10.5)
CO2: 27 meq/L (ref 19–32)
CREATININE: 0.6 mg/dL (ref 0.4–1.2)
Chloride: 105 mEq/L (ref 96–112)
GFR: 104.29 mL/min (ref >60.00)
GLUCOSE: 97 mg/dL (ref 70–99)
Potassium: 4.5 mEq/L (ref 3.5–5.1)
Sodium: 140 mEq/L (ref 135–145)

## 2014-11-15 LAB — HEPATIC FUNCTION PANEL
ALT: 9 U/L (ref 0–35)
AST: 21 U/L (ref 0–37)
Albumin: 4.2 g/dL (ref 3.5–5.2)
Alkaline Phosphatase: 94 U/L (ref 39–117)
BILIRUBIN TOTAL: 0.8 mg/dL (ref 0.2–1.2)
Bilirubin, Direct: 0.1 mg/dL (ref 0.0–0.3)
Total Protein: 6.8 g/dL (ref 6.0–8.3)

## 2014-11-15 LAB — LIPID PANEL
CHOLESTEROL: 234 mg/dL — AB (ref 0–200)
HDL: 75.1 mg/dL (ref >39.00)
LDL Cholesterol: 141 mg/dL — ABNORMAL HIGH (ref 0–99)
NonHDL: 158.9
Total CHOL/HDL Ratio: 3
Triglycerides: 92 mg/dL (ref 0.0–149.0)
VLDL: 18.4 mg/dL (ref 0.0–40.0)

## 2014-11-15 LAB — VITAMIN D 25 HYDROXY (VIT D DEFICIENCY, FRACTURES): VITD: 11.94 ng/mL — ABNORMAL LOW (ref 30.00–100.00)

## 2014-11-15 LAB — TSH: TSH: 2.06 u[IU]/mL (ref 0.35–4.50)

## 2014-11-16 ENCOUNTER — Ambulatory Visit (INDEPENDENT_AMBULATORY_CARE_PROVIDER_SITE_OTHER): Payer: Medicare Other | Admitting: Internal Medicine

## 2014-11-16 ENCOUNTER — Encounter: Payer: Self-pay | Admitting: Internal Medicine

## 2014-11-16 VITALS — BP 130/80 | HR 80 | Temp 98.3°F | Ht 63.0 in | Wt 123.5 lb

## 2014-11-16 DIAGNOSIS — K219 Gastro-esophageal reflux disease without esophagitis: Secondary | ICD-10-CM

## 2014-11-16 DIAGNOSIS — E7800 Pure hypercholesterolemia, unspecified: Secondary | ICD-10-CM

## 2014-11-16 DIAGNOSIS — S2220XD Unspecified fracture of sternum, subsequent encounter for fracture with routine healing: Secondary | ICD-10-CM

## 2014-11-16 DIAGNOSIS — E039 Hypothyroidism, unspecified: Secondary | ICD-10-CM | POA: Diagnosis not present

## 2014-11-16 DIAGNOSIS — E559 Vitamin D deficiency, unspecified: Secondary | ICD-10-CM | POA: Diagnosis not present

## 2014-11-16 DIAGNOSIS — I10 Essential (primary) hypertension: Secondary | ICD-10-CM | POA: Diagnosis not present

## 2014-11-16 MED ORDER — ERGOCALCIFEROL 1.25 MG (50000 UT) PO CAPS: 50000.0000 [IU] | ORAL_CAPSULE | ORAL | Status: DC

## 2014-11-16 NOTE — Progress Notes (Signed)
Pre visit review using our clinic review tool, if applicable. No additional management support is needed unless otherwise documented below in the visit note. 
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
## 2014-11-21 ENCOUNTER — Encounter: Payer: Self-pay | Admitting: Internal Medicine

## 2014-11-21 DIAGNOSIS — E559 Vitamin D deficiency, unspecified: Secondary | ICD-10-CM | POA: Insufficient documentation

## 2014-11-21 DIAGNOSIS — S2220XA Unspecified fracture of sternum, initial encounter for closed fracture: Secondary | ICD-10-CM | POA: Insufficient documentation

## 2014-11-22 ENCOUNTER — Other Ambulatory Visit: Payer: Self-pay | Admitting: Internal Medicine

## 2014-11-29 ENCOUNTER — Telehealth: Payer: Self-pay | Admitting: *Deleted

## 2014-11-29 NOTE — Telephone Encounter (Signed)
Pt.notified
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
## 2014-12-14 ENCOUNTER — Other Ambulatory Visit: Payer: Self-pay | Admitting: Internal Medicine

## 2014-12-22 DIAGNOSIS — I10 Essential (primary) hypertension: Secondary | ICD-10-CM | POA: Diagnosis not present

## 2014-12-22 DIAGNOSIS — I6523 Occlusion and stenosis of bilateral carotid arteries: Secondary | ICD-10-CM | POA: Diagnosis not present

## 2014-12-22 DIAGNOSIS — E782 Mixed hyperlipidemia: Secondary | ICD-10-CM | POA: Diagnosis not present

## 2014-12-22 DIAGNOSIS — R072 Precordial pain: Secondary | ICD-10-CM | POA: Diagnosis not present

## 2014-12-27 ENCOUNTER — Other Ambulatory Visit: Payer: Self-pay | Admitting: Internal Medicine

## 2014-12-27 NOTE — Telephone Encounter (Signed)
OK to fill? Last OV 11/26/14

## 2014-12-27 NOTE — Telephone Encounter (Signed)
Refilled trazodone #30 with 2 refills.  

## 2015-01-08 ENCOUNTER — Other Ambulatory Visit: Payer: Self-pay | Admitting: Internal Medicine

## 2015-01-11 ENCOUNTER — Ambulatory Visit: Payer: Medicare Other | Admitting: Internal Medicine
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
## 2015-02-07 ENCOUNTER — Other Ambulatory Visit: Payer: Self-pay | Admitting: Internal Medicine

## 2015-02-11 ENCOUNTER — Other Ambulatory Visit: Payer: Self-pay | Admitting: Internal Medicine
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
## 2015-03-21 ENCOUNTER — Other Ambulatory Visit: Payer: Self-pay | Admitting: Internal Medicine

## 2015-03-31 ENCOUNTER — Other Ambulatory Visit: Payer: Self-pay | Admitting: Internal Medicine

## 2015-05-20 ENCOUNTER — Other Ambulatory Visit: Payer: Self-pay | Admitting: Internal Medicine

## 2015-06-14 ENCOUNTER — Encounter: Payer: Self-pay | Admitting: Internal Medicine

## 2015-06-14 ENCOUNTER — Ambulatory Visit (INDEPENDENT_AMBULATORY_CARE_PROVIDER_SITE_OTHER): Payer: Medicare Other | Admitting: Internal Medicine

## 2015-06-14 VITALS — BP 140/80 | HR 99 | Temp 98.5°F | Ht 63.0 in | Wt 122.4 lb

## 2015-06-14 DIAGNOSIS — Z0000 Encounter for general adult medical examination without abnormal findings: Secondary | ICD-10-CM

## 2015-06-14 DIAGNOSIS — E039 Hypothyroidism, unspecified: Secondary | ICD-10-CM

## 2015-06-14 DIAGNOSIS — F439 Reaction to severe stress, unspecified: Secondary | ICD-10-CM

## 2015-06-14 DIAGNOSIS — K219 Gastro-esophageal reflux disease without esophagitis: Secondary | ICD-10-CM

## 2015-06-14 DIAGNOSIS — G4700 Insomnia, unspecified: Secondary | ICD-10-CM

## 2015-06-14 DIAGNOSIS — E7800 Pure hypercholesterolemia, unspecified: Secondary | ICD-10-CM

## 2015-06-14 DIAGNOSIS — E559 Vitamin D deficiency, unspecified: Secondary | ICD-10-CM

## 2015-06-14 DIAGNOSIS — Z1239 Encounter for other screening for malignant neoplasm of breast: Secondary | ICD-10-CM

## 2015-06-14 DIAGNOSIS — I10 Essential (primary) hypertension: Secondary | ICD-10-CM

## 2015-06-14 DIAGNOSIS — Z658 Other specified problems related to psychosocial circumstances: Secondary | ICD-10-CM

## 2015-06-14 NOTE — Progress Notes (Signed)
Pre visit review using our clinic review tool, if applicable. No additional management support is needed unless otherwise documented below in the visit note. 
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
## 2015-06-16 ENCOUNTER — Encounter: Payer: Self-pay | Admitting: Internal Medicine

## 2015-06-16 DIAGNOSIS — F439 Reaction to severe stress, unspecified: Secondary | ICD-10-CM | POA: Insufficient documentation

## 2015-06-16 DIAGNOSIS — Z0000 Encounter for general adult medical examination without abnormal findings: Secondary | ICD-10-CM | POA: Insufficient documentation

## 2015-06-16 NOTE — Assessment & Plan Note (Signed)
Low cholesterol diet and exercise.  On pravastatin.  Follow lipid panel and liver function tests.   

## 2015-06-16 NOTE — Assessment & Plan Note (Signed)
Increased stress as outlined.  Discussed with her today.  She is on zoloft.  Will increase trazodone to help her sleep.  She desires no further intervention.  Follow.

## 2015-06-16 NOTE — Assessment & Plan Note (Signed)
On 1/2 trazodone.  Feels may need whole tablet.  Ok to increase.  Follow.

## 2015-06-16 NOTE — Assessment & Plan Note (Signed)
Symptoms controlled on protonix.   

## 2015-06-16 NOTE — Assessment & Plan Note (Signed)
On synthroid.  Follow tsh.   

## 2015-06-16 NOTE — Assessment & Plan Note (Signed)
Overdue physical.  Schedule for next visit.  Schedule mammogram.  Overdue.  Colonoscopy 08/17/10.

## 2015-06-16 NOTE — Assessment & Plan Note (Signed)
Blood pressure under good control.  Continue same medication regimen.  Follow pressures.  Follow metabolic panel.   

## 2015-06-16 NOTE — Assessment & Plan Note (Signed)
Continue vitamin D supplementation 

## 2015-06-17 ENCOUNTER — Other Ambulatory Visit: Payer: Self-pay | Admitting: Internal Medicine

## 2015-06-20 ENCOUNTER — Other Ambulatory Visit (INDEPENDENT_AMBULATORY_CARE_PROVIDER_SITE_OTHER): Payer: Medicare Other

## 2015-06-20 ENCOUNTER — Other Ambulatory Visit: Payer: Self-pay | Admitting: Internal Medicine

## 2015-06-20 DIAGNOSIS — E559 Vitamin D deficiency, unspecified: Secondary | ICD-10-CM

## 2015-06-20 DIAGNOSIS — I10 Essential (primary) hypertension: Secondary | ICD-10-CM

## 2015-06-20 DIAGNOSIS — E7800 Pure hypercholesterolemia, unspecified: Secondary | ICD-10-CM

## 2015-06-20 LAB — BASIC METABOLIC PANEL
BUN: 26 mg/dL — AB (ref 6–23)
CALCIUM: 9.2 mg/dL (ref 8.4–10.5)
CHLORIDE: 105 meq/L (ref 96–112)
CO2: 27 meq/L (ref 19–32)
Creatinine, Ser: 0.74 mg/dL (ref 0.40–1.20)
GFR: 80.18 mL/min (ref >60.00)
Glucose, Bld: 101 mg/dL — ABNORMAL HIGH (ref 70–99)
Potassium: 4.4 mEq/L (ref 3.5–5.1)
Sodium: 141 mEq/L (ref 135–145)

## 2015-06-20 LAB — LIPID PANEL
Cholesterol: 211 mg/dL — ABNORMAL HIGH (ref 0–200)
HDL: 78.7 mg/dL (ref >39.00)
LDL Cholesterol: 119 mg/dL — ABNORMAL HIGH (ref 0–99)
NONHDL: 132.56
Total CHOL/HDL Ratio: 3
Triglycerides: 67 mg/dL (ref 0.0–149.0)
VLDL: 13.4 mg/dL (ref 0.0–40.0)

## 2015-06-20 LAB — HEPATIC FUNCTION PANEL
ALT: 6 U/L (ref 0–35)
AST: 20 U/L (ref 0–37)
Albumin: 4 g/dL (ref 3.5–5.2)
Alkaline Phosphatase: 103 U/L (ref 39–117)
BILIRUBIN TOTAL: 0.8 mg/dL (ref 0.2–1.2)
Bilirubin, Direct: 0.1 mg/dL (ref 0.0–0.3)
TOTAL PROTEIN: 6.6 g/dL (ref 6.0–8.3)

## 2015-06-20 LAB — VITAMIN D 25 HYDROXY (VIT D DEFICIENCY, FRACTURES): VITD: 16.65 ng/mL — ABNORMAL LOW (ref 30.00–100.00)

## 2015-06-20 MED ORDER — VITAMIN D (ERGOCALCIFEROL) 1.25 MG (50000 UNIT) PO CAPS: 50000.0000 [IU] | ORAL_CAPSULE | ORAL | Status: DC

## 2015-06-20 NOTE — Progress Notes (Signed)
rx sent in for ergocalciferol #4 with 2 refills.

## 2015-07-15 ENCOUNTER — Ambulatory Visit
Admission: RE | Admit: 2015-07-15 | Discharge: 2015-07-15 | Disposition: A | Discharge status: Home / Self Care | Payer: Medicare Other | Source: Ambulatory Visit | Attending: Internal Medicine | Admitting: Internal Medicine

## 2015-07-15 DIAGNOSIS — Z1239 Encounter for other screening for malignant neoplasm of breast: Secondary | ICD-10-CM

## 2015-07-20 ENCOUNTER — Other Ambulatory Visit: Payer: Medicare Other | Admitting: Internal Medicine

## 2015-07-20 ENCOUNTER — Ambulatory Visit
Admission: RE | Admit: 2015-07-20 | Discharge: 2015-07-20 | Disposition: A | Discharge status: Home / Self Care | Payer: Medicare Other | Source: Ambulatory Visit | Attending: Internal Medicine | Admitting: Internal Medicine

## 2015-07-20 DIAGNOSIS — Z1231 Encounter for screening mammogram for malignant neoplasm of breast: Secondary | ICD-10-CM

## 2015-07-20 DIAGNOSIS — Z1239 Encounter for other screening for malignant neoplasm of breast: Secondary | ICD-10-CM

## 2015-08-07 ENCOUNTER — Other Ambulatory Visit: Payer: Self-pay | Admitting: Internal Medicine

## 2015-09-15 ENCOUNTER — Ambulatory Visit: Payer: Medicare Other | Admitting: Internal Medicine

## 2015-09-20 ENCOUNTER — Other Ambulatory Visit: Payer: Self-pay | Admitting: Internal Medicine

## 2015-09-23 ENCOUNTER — Telehealth: Payer: Self-pay | Admitting: Internal Medicine

## 2015-09-23 NOTE — Telephone Encounter (Signed)
Pt lvm on my extension wanting to speak to Dr. Bary Leriche nurse. This is all she said. Please call [phone]

## 2015-09-26 NOTE — Telephone Encounter (Signed)
I was not in the office on Friday. Pt basically just wanted an appt for her cough. She has one on Wed.

## 2015-09-28 ENCOUNTER — Encounter: Payer: Self-pay | Admitting: Internal Medicine

## 2015-09-28 ENCOUNTER — Ambulatory Visit (INDEPENDENT_AMBULATORY_CARE_PROVIDER_SITE_OTHER): Payer: Medicare Other | Admitting: Internal Medicine

## 2015-09-28 VITALS — BP 120/80 | HR 107 | Temp 98.4°F | Resp 18 | Ht 63.0 in | Wt 118.0 lb

## 2015-09-28 DIAGNOSIS — E039 Hypothyroidism, unspecified: Secondary | ICD-10-CM

## 2015-09-28 DIAGNOSIS — M810 Age-related osteoporosis without current pathological fracture: Secondary | ICD-10-CM | POA: Diagnosis not present

## 2015-09-28 DIAGNOSIS — F439 Reaction to severe stress, unspecified: Secondary | ICD-10-CM

## 2015-09-28 DIAGNOSIS — J069 Acute upper respiratory infection, unspecified: Secondary | ICD-10-CM

## 2015-09-28 DIAGNOSIS — E559 Vitamin D deficiency, unspecified: Secondary | ICD-10-CM

## 2015-09-28 DIAGNOSIS — K219 Gastro-esophageal reflux disease without esophagitis: Secondary | ICD-10-CM | POA: Diagnosis not present

## 2015-09-28 DIAGNOSIS — Z658 Other specified problems related to psychosocial circumstances: Secondary | ICD-10-CM

## 2015-09-28 DIAGNOSIS — E7800 Pure hypercholesterolemia, unspecified: Secondary | ICD-10-CM

## 2015-09-28 DIAGNOSIS — I10 Essential (primary) hypertension: Secondary | ICD-10-CM | POA: Diagnosis not present

## 2015-09-28 MED ORDER — CEFUROXIME AXETIL 250 MG PO TABS: 250.0000 mg | ORAL_TABLET | Freq: Two times a day (BID) | ORAL | Status: DC

## 2015-09-28 NOTE — Progress Notes (Signed)
Pre-visit discussion using our clinic review tool. No additional management support is needed unless otherwise documented below in the visit note.  
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
## 2015-09-28 NOTE — Patient Instructions (Signed)
mucinex in the am   Robitussin DM in the evening.  Take align one per day while on antibiotics and then continue for two weeks after completing the antibiotics.

## 2015-10-03 ENCOUNTER — Encounter: Payer: Self-pay | Admitting: Internal Medicine

## 2015-10-03 DIAGNOSIS — J069 Acute upper respiratory infection, unspecified: Secondary | ICD-10-CM | POA: Insufficient documentation

## 2015-10-03 NOTE — Assessment & Plan Note (Signed)
Blood pressure under good control.  Continue same medication regimen.  Follow pressures.  Follow metabolic panel.   

## 2015-10-03 NOTE — Assessment & Plan Note (Signed)
Recent vitamin D level - low.  On replacement.  Follow vitamin D level.

## 2015-10-03 NOTE — Assessment & Plan Note (Signed)
On pravastatin.  Low cholesterol diet and exercise.  Follow lipid panel and liver function tests.   

## 2015-10-03 NOTE — Assessment & Plan Note (Signed)
Persistent cough as outlined.  ceftin as directed.  Follow.  mucinex and robitussin as directed.

## 2015-10-03 NOTE — Assessment & Plan Note (Signed)
Symptoms controlled on protonix.   

## 2015-10-03 NOTE — Assessment & Plan Note (Signed)
Overall she feels she is doing relatively well.  Does not feel she needs anything more at this point.  On trazodone and zoloft.  Follow.

## 2015-10-03 NOTE — Assessment & Plan Note (Signed)
Continue vitamin D supplementation.  Follow.    

## 2015-10-03 NOTE — Assessment & Plan Note (Signed)
On thyroid replacement.  Follow tsh.  

## 2015-10-12 ENCOUNTER — Ambulatory Visit: Payer: Medicare Other | Admitting: Internal Medicine

## 2015-10-28 ENCOUNTER — Other Ambulatory Visit: Payer: Self-pay | Admitting: Internal Medicine

## 2015-11-12 ENCOUNTER — Other Ambulatory Visit: Payer: Self-pay | Admitting: Internal Medicine

## 2015-12-07 ENCOUNTER — Other Ambulatory Visit (INDEPENDENT_AMBULATORY_CARE_PROVIDER_SITE_OTHER): Payer: Medicare Other

## 2015-12-07 DIAGNOSIS — E039 Hypothyroidism, unspecified: Secondary | ICD-10-CM | POA: Diagnosis not present

## 2015-12-07 DIAGNOSIS — I10 Essential (primary) hypertension: Secondary | ICD-10-CM

## 2015-12-07 DIAGNOSIS — E7800 Pure hypercholesterolemia, unspecified: Secondary | ICD-10-CM | POA: Diagnosis not present

## 2015-12-07 LAB — BASIC METABOLIC PANEL
BUN: 23 mg/dL (ref 6–23)
CO2: 26 mEq/L (ref 19–32)
CREATININE: 0.65 mg/dL (ref 0.40–1.20)
Calcium: 9 mg/dL (ref 8.4–10.5)
Chloride: 105 mEq/L (ref 96–112)
GFR: 93.02 mL/min (ref >60.00)
Glucose, Bld: 95 mg/dL (ref 70–99)
Potassium: 3.8 mEq/L (ref 3.5–5.1)
Sodium: 140 mEq/L (ref 135–145)

## 2015-12-07 LAB — CBC WITH DIFFERENTIAL/PLATELET
BASOS PCT: 0.5 % (ref 0.0–3.0)
Basophils Absolute: 0 10*3/uL (ref 0.0–0.1)
Eosinophils Absolute: 0.2 10*3/uL (ref 0.0–0.7)
Eosinophils Relative: 3.5 % (ref 0.0–5.0)
HEMATOCRIT: 42 % (ref 36.0–46.0)
Hemoglobin: 13.8 g/dL (ref 12.0–15.0)
LYMPHS ABS: 1.6 10*3/uL (ref 0.7–4.0)
Lymphocytes Relative: 29.9 % (ref 12.0–46.0)
MCHC: 32.8 g/dL (ref 30.0–36.0)
MCV: 90.1 fl (ref 78.0–100.0)
Monocytes Absolute: 0.5 10*3/uL (ref 0.1–1.0)
Monocytes Relative: 8.6 % (ref 3.0–12.0)
NEUTROS ABS: 3.1 10*3/uL (ref 1.4–7.7)
Neutrophils Relative %: 57.5 % (ref 43.0–77.0)
PLATELETS: 256 10*3/uL (ref 150.0–400.0)
RBC: 4.66 Mil/uL (ref 3.87–5.11)
RDW: 13.7 % (ref 11.5–15.5)
WBC: 5.3 10*3/uL (ref 4.0–10.5)

## 2015-12-07 LAB — HEPATIC FUNCTION PANEL
ALK PHOS: 102 U/L (ref 39–117)
ALT: 8 U/L (ref 0–35)
AST: 19 U/L (ref 0–37)
Albumin: 4.1 g/dL (ref 3.5–5.2)
BILIRUBIN DIRECT: 0.2 mg/dL (ref 0.0–0.3)
Total Bilirubin: 0.7 mg/dL (ref 0.2–1.2)
Total Protein: 7 g/dL (ref 6.0–8.3)

## 2015-12-07 LAB — LIPID PANEL
CHOL/HDL RATIO: 2
CHOLESTEROL: 223 mg/dL — AB (ref 0–200)
HDL: 91 mg/dL (ref >39.00)
LDL Cholesterol: 119 mg/dL — ABNORMAL HIGH (ref 0–99)
NonHDL: 132.32
TRIGLYCERIDES: 65 mg/dL (ref 0.0–149.0)
VLDL: 13 mg/dL (ref 0.0–40.0)

## 2015-12-07 LAB — TSH: TSH: 4.76 u[IU]/mL — AB (ref 0.35–4.50)

## 2015-12-09 ENCOUNTER — Ambulatory Visit (INDEPENDENT_AMBULATORY_CARE_PROVIDER_SITE_OTHER): Payer: Medicare Other | Admitting: Internal Medicine

## 2015-12-09 ENCOUNTER — Encounter: Payer: Self-pay | Admitting: Internal Medicine

## 2015-12-09 VITALS — BP 122/87 | HR 80 | Temp 98.2°F | Resp 18 | Ht 63.0 in | Wt 120.0 lb

## 2015-12-09 DIAGNOSIS — Z23 Encounter for immunization: Secondary | ICD-10-CM

## 2015-12-09 DIAGNOSIS — K219 Gastro-esophageal reflux disease without esophagitis: Secondary | ICD-10-CM

## 2015-12-09 DIAGNOSIS — M810 Age-related osteoporosis without current pathological fracture: Secondary | ICD-10-CM

## 2015-12-09 DIAGNOSIS — I10 Essential (primary) hypertension: Secondary | ICD-10-CM | POA: Diagnosis not present

## 2015-12-09 DIAGNOSIS — R55 Syncope and collapse: Secondary | ICD-10-CM

## 2015-12-09 DIAGNOSIS — F439 Reaction to severe stress, unspecified: Secondary | ICD-10-CM

## 2015-12-09 DIAGNOSIS — Z658 Other specified problems related to psychosocial circumstances: Secondary | ICD-10-CM

## 2015-12-09 DIAGNOSIS — E7800 Pure hypercholesterolemia, unspecified: Secondary | ICD-10-CM

## 2015-12-09 DIAGNOSIS — E039 Hypothyroidism, unspecified: Secondary | ICD-10-CM | POA: Diagnosis not present

## 2015-12-09 DIAGNOSIS — E559 Vitamin D deficiency, unspecified: Secondary | ICD-10-CM

## 2015-12-09 DIAGNOSIS — Z0000 Encounter for general adult medical examination without abnormal findings: Secondary | ICD-10-CM

## 2015-12-09 NOTE — Progress Notes (Signed)
Pre-visit discussion using our clinic review tool. No additional management support is needed unless otherwise documented below in the visit note.  
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
## 2015-12-11 ENCOUNTER — Encounter: Payer: Self-pay | Admitting: Internal Medicine

## 2015-12-11 NOTE — Assessment & Plan Note (Signed)
Blood pressure under good control.  Continue same medication regimen.  Follow pressures.  Follow metabolic panel.   

## 2015-12-11 NOTE — Assessment & Plan Note (Addendum)
On thyroid replacement.  Follow tsh.  Recently slightly elevated tsh.  Has missed a few doses.  Hold on changing synthroid.  Recheck tsh in 6 weeks.

## 2015-12-11 NOTE — Assessment & Plan Note (Signed)
Increased stress as outlined.  She has good support.  Does not feel needs any further intervention at this time.  Follow.

## 2015-12-11 NOTE — Assessment & Plan Note (Signed)
Vitamin D level low 06/2015.  Continue replacement.  Follow.  Needs f/u bond density.  Will get the above sorted through.  Follow.

## 2015-12-11 NOTE — Assessment & Plan Note (Signed)
She exercises regularly.  No cardiac symptoms with increased activity or exertion.  Has previously seen Dr Nehemiah Massed.  Had stress testing 2011 negative for ischemia.  Had the two recent episodes of syncope as outlined.  No seizure activity noted.  Had witnessed episodes.  She feels related to stress.  Saw neurology years ago.  States had MRI and w/up then.   Was told had some form of epilepsy.  Was on phenobarbital for a while.  Off for years.  Unclear etiology.  Blood pressure doing well.  Have neurology revaluated.  Wanted to hold on further cardiac testing.  Instructed not to drive until can get these issues sorted through.

## 2015-12-11 NOTE — Assessment & Plan Note (Signed)
Continue vitamin D supplementation 

## 2015-12-11 NOTE — Assessment & Plan Note (Signed)
Physical today 12/09/15.  Mammogram 07/20/15 - Birads I.  Colonoscopy 08/17/2010.  Will need f/u bond density.

## 2015-12-11 NOTE — Assessment & Plan Note (Signed)
On pravastatin.  Low cholesterol diet and exercise.  Follow lipid panel and liver function tests.   

## 2015-12-11 NOTE — Assessment & Plan Note (Signed)
Symptoms controlled on protonix.  Follow.   

## 2015-12-24 ENCOUNTER — Other Ambulatory Visit: Payer: Self-pay | Admitting: Internal Medicine

## 2016-01-19 DIAGNOSIS — R55 Syncope and collapse: Secondary | ICD-10-CM | POA: Diagnosis not present

## 2016-01-20 ENCOUNTER — Other Ambulatory Visit (INDEPENDENT_AMBULATORY_CARE_PROVIDER_SITE_OTHER): Payer: Medicare Other

## 2016-01-20 DIAGNOSIS — E039 Hypothyroidism, unspecified: Secondary | ICD-10-CM

## 2016-01-20 LAB — TSH: TSH: 1.86 u[IU]/mL (ref 0.35–4.50)

## 2016-01-23 ENCOUNTER — Encounter: Payer: Self-pay | Admitting: *Deleted

## 2016-02-03 ENCOUNTER — Telehealth: Payer: Self-pay

## 2016-02-03 NOTE — Telephone Encounter (Signed)
PA for Telmisartan started on cover my meds.

## 2016-02-07 NOTE — Telephone Encounter (Signed)
PA approved from 11/13/2015-01/11/2017

## 2016-02-14 ENCOUNTER — Other Ambulatory Visit: Payer: Self-pay | Admitting: Internal Medicine

## 2016-02-14 NOTE — Telephone Encounter (Signed)
OK to refill trazodone?

## 2016-02-15 NOTE — Telephone Encounter (Signed)
ok'd refill trazodone #[id number] with 2 refills.   

## 2016-02-17 ENCOUNTER — Ambulatory Visit (INDEPENDENT_AMBULATORY_CARE_PROVIDER_SITE_OTHER): Payer: Medicare Other | Admitting: Internal Medicine

## 2016-02-17 ENCOUNTER — Encounter: Payer: Self-pay | Admitting: Internal Medicine

## 2016-02-17 VITALS — BP 124/84 | HR 82 | Temp 98.3°F | Resp 18 | Ht 63.0 in | Wt 119.0 lb

## 2016-02-17 DIAGNOSIS — I10 Essential (primary) hypertension: Secondary | ICD-10-CM

## 2016-02-17 DIAGNOSIS — Z658 Other specified problems related to psychosocial circumstances: Secondary | ICD-10-CM

## 2016-02-17 DIAGNOSIS — E7800 Pure hypercholesterolemia, unspecified: Secondary | ICD-10-CM

## 2016-02-17 DIAGNOSIS — E039 Hypothyroidism, unspecified: Secondary | ICD-10-CM | POA: Diagnosis not present

## 2016-02-17 DIAGNOSIS — K219 Gastro-esophageal reflux disease without esophagitis: Secondary | ICD-10-CM

## 2016-02-17 DIAGNOSIS — R55 Syncope and collapse: Secondary | ICD-10-CM

## 2016-02-17 DIAGNOSIS — G4700 Insomnia, unspecified: Secondary | ICD-10-CM

## 2016-02-17 DIAGNOSIS — F439 Reaction to severe stress, unspecified: Secondary | ICD-10-CM
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
## 2016-02-17 NOTE — Progress Notes (Signed)
Pre-visit discussion using our clinic review tool. No additional management support is needed unless otherwise documented below in the visit note.  

## 2016-02-19 ENCOUNTER — Encounter: Payer: Self-pay | Admitting: Internal Medicine

## 2016-02-19 NOTE — Assessment & Plan Note (Signed)
No further episodes.  Saw neurology.  See Dr Trena Platt note for details.  Decided not to pursue further w/up at this time.  Follow.

## 2016-02-19 NOTE — Assessment & Plan Note (Signed)
Has trazodone if needed.  Stress is better.  Follow.

## 2016-02-19 NOTE — Assessment & Plan Note (Signed)
Stress is better.  Her sister is now in assisted living.  This has decreased her stress.  Follow.  Does not feel she needs any further intervention.  On zoloft.

## 2016-02-19 NOTE — Assessment & Plan Note (Signed)
Symptoms controlled on protonix.   

## 2016-02-19 NOTE — Assessment & Plan Note (Signed)
On thyroid replacement.  Follow tsh.  

## 2016-02-19 NOTE — Assessment & Plan Note (Signed)
On pravastatin.  Low cholesterol diet and exercise.  Follow  Lipid panel and liver function tests.   

## 2016-02-19 NOTE — Assessment & Plan Note (Signed)
Blood pressure under good control.  Continue same medication regimen.  Follow pressures.  Follow metabolic panel.   

## 2016-04-24 ENCOUNTER — Other Ambulatory Visit: Payer: Self-pay | Admitting: Internal Medicine

## 2016-05-11 ENCOUNTER — Other Ambulatory Visit (INDEPENDENT_AMBULATORY_CARE_PROVIDER_SITE_OTHER): Payer: Medicare Other

## 2016-05-11 DIAGNOSIS — E7800 Pure hypercholesterolemia, unspecified: Secondary | ICD-10-CM | POA: Diagnosis not present

## 2016-05-11 DIAGNOSIS — I10 Essential (primary) hypertension: Secondary | ICD-10-CM

## 2016-05-11 DIAGNOSIS — E039 Hypothyroidism, unspecified: Secondary | ICD-10-CM

## 2016-05-11 LAB — HEPATIC FUNCTION PANEL
ALBUMIN: 4.1 g/dL (ref 3.5–5.2)
ALK PHOS: 97 U/L (ref 39–117)
ALT: 6 U/L (ref 0–35)
AST: 13 U/L (ref 0–37)
Bilirubin, Direct: 0.1 mg/dL (ref 0.0–0.3)
TOTAL PROTEIN: 7.2 g/dL (ref 6.0–8.3)
Total Bilirubin: 0.9 mg/dL (ref 0.2–1.2)

## 2016-05-11 LAB — BASIC METABOLIC PANEL
BUN: 25 mg/dL — ABNORMAL HIGH (ref 6–23)
CHLORIDE: 103 meq/L (ref 96–112)
CO2: 31 mEq/L (ref 19–32)
CREATININE: 0.65 mg/dL (ref 0.40–1.20)
Calcium: 9.6 mg/dL (ref 8.4–10.5)
GFR: 92.92 mL/min (ref >60.00)
Glucose, Bld: 90 mg/dL (ref 70–99)
Potassium: 3.9 mEq/L (ref 3.5–5.1)
Sodium: 140 mEq/L (ref 135–145)

## 2016-05-11 LAB — LIPID PANEL
CHOL/HDL RATIO: 3
CHOLESTEROL: 213 mg/dL — AB (ref 0–200)
HDL: 70.3 mg/dL (ref >39.00)
LDL CALC: 126 mg/dL — AB (ref 0–99)
NonHDL: 142.51
TRIGLYCERIDES: 82 mg/dL (ref 0.0–149.0)
VLDL: 16.4 mg/dL (ref 0.0–40.0)

## 2016-05-11 LAB — TSH: TSH: 0.47 u[IU]/mL (ref 0.35–4.50)

## 2016-05-12 ENCOUNTER — Other Ambulatory Visit: Payer: Self-pay | Admitting: Internal Medicine

## 2016-05-18 ENCOUNTER — Ambulatory Visit (INDEPENDENT_AMBULATORY_CARE_PROVIDER_SITE_OTHER): Payer: Medicare Other | Admitting: Internal Medicine

## 2016-05-18 ENCOUNTER — Ambulatory Visit (INDEPENDENT_AMBULATORY_CARE_PROVIDER_SITE_OTHER): Payer: Medicare HMO

## 2016-05-18 ENCOUNTER — Encounter: Payer: Self-pay | Admitting: Internal Medicine

## 2016-05-18 VITALS — BP 120/80 | HR 86 | Temp 98.5°F | Resp 18 | Ht 63.0 in | Wt 114.2 lb

## 2016-05-18 DIAGNOSIS — I10 Essential (primary) hypertension: Secondary | ICD-10-CM

## 2016-05-18 DIAGNOSIS — R1032 Left lower quadrant pain: Secondary | ICD-10-CM

## 2016-05-18 DIAGNOSIS — K219 Gastro-esophageal reflux disease without esophagitis: Secondary | ICD-10-CM

## 2016-05-18 DIAGNOSIS — Z1231 Encounter for screening mammogram for malignant neoplasm of breast: Secondary | ICD-10-CM | POA: Diagnosis not present

## 2016-05-18 DIAGNOSIS — R634 Abnormal weight loss: Secondary | ICD-10-CM | POA: Insufficient documentation

## 2016-05-18 DIAGNOSIS — E7800 Pure hypercholesterolemia, unspecified: Secondary | ICD-10-CM

## 2016-05-18 DIAGNOSIS — Z1239 Encounter for other screening for malignant neoplasm of breast: Secondary | ICD-10-CM | POA: Diagnosis not present

## 2016-05-18 DIAGNOSIS — M545 Low back pain, unspecified: Secondary | ICD-10-CM | POA: Insufficient documentation

## 2016-05-18 DIAGNOSIS — L989 Disorder of the skin and subcutaneous tissue, unspecified: Secondary | ICD-10-CM

## 2016-05-18 DIAGNOSIS — E039 Hypothyroidism, unspecified: Secondary | ICD-10-CM

## 2016-05-18 NOTE — Progress Notes (Signed)
Pre-visit discussion using our clinic review tool. No additional management support is needed unless otherwise documented below in the visit note.  
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
## 2016-05-20 ENCOUNTER — Encounter: Payer: Self-pay | Admitting: Internal Medicine

## 2016-05-20 NOTE — Assessment & Plan Note (Signed)
Left lower quadrant pain and fullness noted on exam.  Given this and weight loss, will obtain CT abdomen/pelvis.

## 2016-05-20 NOTE — Assessment & Plan Note (Signed)
Blood pressure under good control.  Continue same medication regimen.  Follow pressures.  Follow metabolic panel.   

## 2016-05-20 NOTE — Assessment & Plan Note (Signed)
On thyroid replacement.  Follow tsh.  

## 2016-05-20 NOTE — Assessment & Plan Note (Signed)
On pravastatin.  Cholesterol reviewed.  Will follow.  Hold on increasing medication.  Follow lipid panel.   Lab Results  Component Value Date   CHOL 213* 05/11/2016   HDL 70.30 05/11/2016   LDLCALC 126* 05/11/2016   LDLDIRECT 105.6 11/26/2013   TRIG 82.0 05/11/2016   CHOLHDL 3 05/11/2016

## 2016-05-20 NOTE — Assessment & Plan Note (Signed)
States she is eating.  Feels has a good appetite.  With pain, will obtain CT scan as outlined.  Labs just checked and reviewed.  Encourage increased po intake.

## 2016-05-20 NOTE — Assessment & Plan Note (Signed)
Persistent low back pain and exam as outlined.  Check xray.  Further w/up pending results.  Continue tylenol.  Does help.

## 2016-05-20 NOTE — Assessment & Plan Note (Signed)
Symptoms controlled on protonix.  Follow.   

## 2016-05-29 ENCOUNTER — Ambulatory Visit
Admission: RE | Admit: 2016-05-29 | Discharge: 2016-05-29 | Disposition: A | Discharge status: Home / Self Care | Payer: Medicare Other | Source: Ambulatory Visit | Attending: Internal Medicine | Admitting: Internal Medicine

## 2016-05-29 DIAGNOSIS — I700 Atherosclerosis of aorta: Secondary | ICD-10-CM | POA: Diagnosis not present

## 2016-05-29 DIAGNOSIS — M4856XA Collapsed vertebra, not elsewhere classified, lumbar region, initial encounter for fracture: Secondary | ICD-10-CM | POA: Insufficient documentation

## 2016-05-29 DIAGNOSIS — R1032 Left lower quadrant pain: Secondary | ICD-10-CM

## 2016-05-29 DIAGNOSIS — Z90710 Acquired absence of both cervix and uterus: Secondary | ICD-10-CM | POA: Diagnosis not present

## 2016-05-29 DIAGNOSIS — K5730 Diverticulosis of large intestine without perforation or abscess without bleeding: Secondary | ICD-10-CM | POA: Insufficient documentation

## 2016-05-29 DIAGNOSIS — R634 Abnormal weight loss: Secondary | ICD-10-CM | POA: Diagnosis present

## 2016-05-29 MED ORDER — IOPAMIDOL (ISOVUE-300) INJECTION 61%
85.0000 mL | Freq: Once | INTRAVENOUS | Status: AC | PRN
  Administered 2016-05-29: 85 mL via INTRAVENOUS

## 2016-06-01 ENCOUNTER — Other Ambulatory Visit: Payer: Self-pay | Admitting: Internal Medicine

## 2016-06-01 DIAGNOSIS — IMO0002 Reserved for concepts with insufficient information to code with codable children: Secondary | ICD-10-CM

## 2016-06-01 DIAGNOSIS — M545 Low back pain: Secondary | ICD-10-CM

## 2016-06-01 NOTE — Progress Notes (Signed)
Order placed for referral to Dr Chasnis.   

## 2016-06-11 DIAGNOSIS — M4850XA Collapsed vertebra, not elsewhere classified, site unspecified, initial encounter for fracture: Secondary | ICD-10-CM | POA: Diagnosis not present

## 2016-07-23 ENCOUNTER — Ambulatory Visit: Admission: RE | Admit: 2016-07-23 | Payer: Medicare Other | Source: Ambulatory Visit

## 2016-07-30 ENCOUNTER — Encounter: Payer: Self-pay | Admitting: Internal Medicine

## 2016-07-30 ENCOUNTER — Ambulatory Visit (INDEPENDENT_AMBULATORY_CARE_PROVIDER_SITE_OTHER): Payer: Medicare Other | Admitting: Internal Medicine

## 2016-07-30 DIAGNOSIS — E7800 Pure hypercholesterolemia, unspecified: Secondary | ICD-10-CM

## 2016-07-30 DIAGNOSIS — M545 Low back pain: Secondary | ICD-10-CM

## 2016-07-30 DIAGNOSIS — Z23 Encounter for immunization: Secondary | ICD-10-CM | POA: Diagnosis not present

## 2016-07-30 DIAGNOSIS — Z658 Other specified problems related to psychosocial circumstances: Secondary | ICD-10-CM | POA: Diagnosis not present

## 2016-07-30 DIAGNOSIS — I10 Essential (primary) hypertension: Secondary | ICD-10-CM

## 2016-07-30 DIAGNOSIS — F439 Reaction to severe stress, unspecified: Secondary | ICD-10-CM

## 2016-07-30 DIAGNOSIS — R634 Abnormal weight loss: Secondary | ICD-10-CM | POA: Diagnosis not present

## 2016-07-30 DIAGNOSIS — E039 Hypothyroidism, unspecified: Secondary | ICD-10-CM
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
## 2016-07-30 NOTE — Progress Notes (Signed)
Pre visit review using our clinic review tool, if applicable. No additional management support is needed unless otherwise documented below in the visit note. 

## 2016-08-01 ENCOUNTER — Ambulatory Visit
Admission: RE | Admit: 2016-08-01 | Discharge: 2016-08-01 | Disposition: A | Discharge status: Home / Self Care | Payer: Medicare Other | Source: Ambulatory Visit | Attending: Internal Medicine | Admitting: Internal Medicine

## 2016-08-01 ENCOUNTER — Other Ambulatory Visit: Payer: Self-pay | Admitting: Internal Medicine

## 2016-08-01 DIAGNOSIS — Z1231 Encounter for screening mammogram for malignant neoplasm of breast: Secondary | ICD-10-CM | POA: Diagnosis not present

## 2016-08-01 DIAGNOSIS — Z1239 Encounter for other screening for malignant neoplasm of breast: Secondary | ICD-10-CM

## 2016-08-02 DIAGNOSIS — S32000D Wedge compression fracture of unspecified lumbar vertebra, subsequent encounter for fracture with routine healing: Secondary | ICD-10-CM | POA: Diagnosis not present

## 2016-08-04 ENCOUNTER — Encounter: Payer: Self-pay | Admitting: Internal Medicine

## 2016-08-04 NOTE — Assessment & Plan Note (Signed)
Stress is better.  She is doing well.  Good family support.  Follow.  On zoloft.

## 2016-08-04 NOTE — Assessment & Plan Note (Signed)
Blood pressure under good control.  Continue same medication regimen.  Follow pressures.  Follow metabolic panel.   

## 2016-08-04 NOTE — Assessment & Plan Note (Signed)
Low cholesterol diet and exercise.  Follow lipid panel and liver function tests.  On pravastatin.   

## 2016-08-04 NOTE — Assessment & Plan Note (Signed)
Seeing Dr Sharlet Salina.  Better.

## 2016-08-04 NOTE — Assessment & Plan Note (Signed)
Persistent.  Asymptomatic.  Feels good.  Appetite good.  Eating well.  Follow.  Get her back in soon to reassess.

## 2016-08-04 NOTE — Assessment & Plan Note (Signed)
On thyroid replacement.  Follow tsh.  

## 2016-08-05 ENCOUNTER — Other Ambulatory Visit: Payer: Self-pay | Admitting: Internal Medicine

## 2016-08-14 ENCOUNTER — Other Ambulatory Visit: Payer: Self-pay | Admitting: Internal Medicine

## 2016-09-21 ENCOUNTER — Ambulatory Visit: Payer: Medicare Other | Admitting: Internal Medicine

## 2016-10-12 ENCOUNTER — Ambulatory Visit: Payer: Medicare HMO | Admitting: Internal Medicine

## 2016-10-19 ENCOUNTER — Ambulatory Visit: Payer: Medicare HMO

## 2016-10-23 ENCOUNTER — Encounter: Payer: Self-pay | Admitting: Internal Medicine

## 2016-10-23 ENCOUNTER — Ambulatory Visit (INDEPENDENT_AMBULATORY_CARE_PROVIDER_SITE_OTHER): Payer: Self-pay | Admitting: Internal Medicine

## 2016-10-23 ENCOUNTER — Ambulatory Visit: Payer: Medicare HMO

## 2016-10-23 VITALS — BP 140/80 | HR 80 | Wt 111.0 lb

## 2016-10-23 DIAGNOSIS — E559 Vitamin D deficiency, unspecified: Secondary | ICD-10-CM

## 2016-10-23 DIAGNOSIS — E039 Hypothyroidism, unspecified: Secondary | ICD-10-CM

## 2016-10-23 DIAGNOSIS — Z0000 Encounter for general adult medical examination without abnormal findings: Secondary | ICD-10-CM

## 2016-10-23 DIAGNOSIS — M545 Low back pain, unspecified: Secondary | ICD-10-CM

## 2016-10-23 DIAGNOSIS — E2839 Other primary ovarian failure: Secondary | ICD-10-CM

## 2016-10-23 DIAGNOSIS — M8000XD Age-related osteoporosis with current pathological fracture, unspecified site, subsequent encounter for fracture with routine healing: Secondary | ICD-10-CM

## 2016-10-23 DIAGNOSIS — I10 Essential (primary) hypertension: Secondary | ICD-10-CM

## 2016-10-23 DIAGNOSIS — R634 Abnormal weight loss: Secondary | ICD-10-CM

## 2016-10-23 DIAGNOSIS — E7800 Pure hypercholesterolemia, unspecified: Secondary | ICD-10-CM

## 2016-10-23 DIAGNOSIS — F439 Reaction to severe stress, unspecified: Secondary | ICD-10-CM

## 2016-10-23 MED ORDER — TELMISARTAN 80 MG PO TABS: 80.0000 mg | ORAL_TABLET | Freq: Every day | ORAL | 1 refills | Status: DC

## 2016-10-23 NOTE — Patient Instructions (Addendum)
  Kaitlyn Bowen , Thank you for taking time to come for your Medicare Wellness Visit. I appreciate your ongoing commitment to your health goals. Please review the following plan we discussed and let me know if I can assist you in the future.   These are the goals we discussed: Goals    . Healthy Lifestyle          Maintain a healthy diet, adequate water intake and using the treadmill for exercise.       This is a list of the screening recommended for you and due dates:  Health Maintenance  Topic Date Due  . DEXA scan (bone density measurement)  01/31/2000  . Shingles Vaccine  01/10/2017*  . Tetanus Vaccine  01/10/2017*  . Mammogram  08/01/2017  . Flu Shot  Completed  . Pneumonia vaccines  Completed  *Topic was postponed. The date shown is not the original due date.   Bone Densitometry Introduction Bone densitometry is an imaging test that uses a special X-ray to measure the amount of calcium and other minerals in your bones (bone density). This test is also known as a bone mineral density test or dual-energy X-ray absorptiometry (DXA). The test can measure bone density at your hip and your spine. It is similar to having a regular X-ray. You may have this test to:  Diagnose a condition that causes weak or thin bones (osteoporosis).  Predict your risk of a broken bone (fracture).  Determine how well osteoporosis treatment is working. Tell a health care provider about:  Any allergies you have.  All medicines you are taking, including vitamins, herbs, eye drops, creams, and over-the-counter medicines.  Any problems you or family members have had with anesthetic medicines.  Any blood disorders you have.  Any surgeries you have had.  Any medical conditions you have.  Possibility of pregnancy.  Any other medical test you had within the previous 14 days that used contrast material. What are the risks? Generally, this is a safe procedure. However, problems can occur and may  include the following:  This test exposes you to a very small amount of radiation.  The risks of radiation exposure may be greater to unborn children. What happens before the procedure?  Do not take any calcium supplements for 24 hours before having the test. You can otherwise eat and drink what you usually do.  Take off all metal jewelry, eyeglasses, dental appliances, and any other metal objects. What happens during the procedure?  You may lie on an exam table. There will be an X-ray generator below you and an imaging device above you.  Other devices, such as boxes or braces, may be used to position your body properly for the scan.  You will need to lie still while the machine slowly scans your body.  The images will show up on a computer monitor. What happens after the procedure? You may need more testing at a later time. This information is not intended to replace advice given to you by your health care provider. Make sure you discuss any questions you have with your health care provider. Document Released: 11/20/2004 Document Revised: 04/05/2016 Document Reviewed: 04/08/2014  2017 Elsevier
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
## 2016-10-25 ENCOUNTER — Encounter: Payer: Self-pay | Admitting: Internal Medicine

## 2016-10-25 NOTE — Assessment & Plan Note (Signed)
Increased stress as outlined.  Has good family support.  Does not feel she needs anything more at this time.  Follow.

## 2016-10-25 NOTE — Assessment & Plan Note (Signed)
Has seen Dr Chasnis.  Better.  

## 2016-10-25 NOTE — Assessment & Plan Note (Signed)
Blood pressure under reasonable control.  On recheck today - better.  Continue same medication regimen.  Follow.

## 2016-10-25 NOTE — Assessment & Plan Note (Signed)
Needs bone density.  Schedule.

## 2016-10-25 NOTE — Assessment & Plan Note (Signed)
Stable.  Eating well.  Follow.

## 2016-10-25 NOTE — Assessment & Plan Note (Signed)
On thyroid replacement.  Follow tsh.  

## 2016-10-25 NOTE — Assessment & Plan Note (Signed)
On pravastatin.  Low cholesterol diet and exercise.  Follow lipid panel and liver function tests.   

## 2016-10-25 NOTE — Assessment & Plan Note (Signed)
Continue vitamin D supplementation 

## 2016-11-10 ENCOUNTER — Other Ambulatory Visit: Payer: Self-pay | Admitting: Internal Medicine

## 2016-11-19 ENCOUNTER — Encounter: Payer: Self-pay | Admitting: Internal Medicine

## 2016-12-26 ENCOUNTER — Ambulatory Visit
Admission: RE | Admit: 2016-12-26 | Discharge: 2016-12-26 | Disposition: A | Discharge status: Home / Self Care | Payer: Medicare Other | Source: Ambulatory Visit | Attending: Internal Medicine | Admitting: Internal Medicine

## 2016-12-26 DIAGNOSIS — M810 Age-related osteoporosis without current pathological fracture: Secondary | ICD-10-CM | POA: Insufficient documentation

## 2016-12-26 DIAGNOSIS — E2839 Other primary ovarian failure: Secondary | ICD-10-CM | POA: Insufficient documentation

## 2016-12-27 ENCOUNTER — Telehealth: Payer: Self-pay | Admitting: *Deleted

## 2016-12-27 NOTE — Telephone Encounter (Signed)
Pt requested test results from a bone density  Pt contact [phone]

## 2016-12-27 NOTE — Telephone Encounter (Signed)
See results notes. 

## 2017-01-28 ENCOUNTER — Other Ambulatory Visit (INDEPENDENT_AMBULATORY_CARE_PROVIDER_SITE_OTHER): Payer: Medicare Other

## 2017-01-28 DIAGNOSIS — E039 Hypothyroidism, unspecified: Secondary | ICD-10-CM | POA: Diagnosis not present

## 2017-01-28 DIAGNOSIS — E559 Vitamin D deficiency, unspecified: Secondary | ICD-10-CM | POA: Diagnosis not present

## 2017-01-28 DIAGNOSIS — I10 Essential (primary) hypertension: Secondary | ICD-10-CM

## 2017-01-28 DIAGNOSIS — E7800 Pure hypercholesterolemia, unspecified: Secondary | ICD-10-CM

## 2017-01-28 LAB — BASIC METABOLIC PANEL
BUN: 21 mg/dL (ref 6–23)
CHLORIDE: 105 meq/L (ref 96–112)
CO2: 29 mEq/L (ref 19–32)
Calcium: 9.6 mg/dL (ref 8.4–10.5)
Creatinine, Ser: 0.72 mg/dL (ref 0.40–1.20)
GFR: 82.42 mL/min (ref >60.00)
Glucose, Bld: 98 mg/dL (ref 70–99)
POTASSIUM: 4.5 meq/L (ref 3.5–5.1)
SODIUM: 142 meq/L (ref 135–145)

## 2017-01-28 LAB — CBC WITH DIFFERENTIAL/PLATELET
BASOS ABS: 0 10*3/uL (ref 0.0–0.1)
BASOS PCT: 0.8 % (ref 0.0–3.0)
Eosinophils Absolute: 0.1 10*3/uL (ref 0.0–0.7)
Eosinophils Relative: 2.3 % (ref 0.0–5.0)
HEMATOCRIT: 43.5 % (ref 36.0–46.0)
Hemoglobin: 14.5 g/dL (ref 12.0–15.0)
LYMPHS ABS: 1.6 10*3/uL (ref 0.7–4.0)
LYMPHS PCT: 35.9 % (ref 12.0–46.0)
MCHC: 33.3 g/dL (ref 30.0–36.0)
MCV: 88.5 fl (ref 78.0–100.0)
MONOS PCT: 9.3 % (ref 3.0–12.0)
Monocytes Absolute: 0.4 10*3/uL (ref 0.1–1.0)
NEUTROS ABS: 2.3 10*3/uL (ref 1.4–7.7)
NEUTROS PCT: 51.7 % (ref 43.0–77.0)
PLATELETS: 244 10*3/uL (ref 150.0–400.0)
RBC: 4.91 Mil/uL (ref 3.87–5.11)
RDW: 13.5 % (ref 11.5–15.5)
WBC: 4.4 10*3/uL (ref 4.0–10.5)

## 2017-01-28 LAB — LIPID PANEL
CHOL/HDL RATIO: 3
Cholesterol: 225 mg/dL — ABNORMAL HIGH (ref 0–200)
HDL: 83.7 mg/dL (ref >39.00)
LDL CALC: 128 mg/dL — AB (ref 0–99)
NONHDL: 141.6
TRIGLYCERIDES: 69 mg/dL (ref 0.0–149.0)
VLDL: 13.8 mg/dL (ref 0.0–40.0)

## 2017-01-28 LAB — HEPATIC FUNCTION PANEL
ALBUMIN: 4.1 g/dL (ref 3.5–5.2)
ALK PHOS: 97 U/L (ref 39–117)
ALT: 7 U/L (ref 0–35)
AST: 16 U/L (ref 0–37)
BILIRUBIN TOTAL: 0.7 mg/dL (ref 0.2–1.2)
Bilirubin, Direct: 0.1 mg/dL (ref 0.0–0.3)
TOTAL PROTEIN: 6.6 g/dL (ref 6.0–8.3)

## 2017-01-28 LAB — TSH: TSH: 0.86 u[IU]/mL (ref 0.35–4.50)

## 2017-01-28 LAB — VITAMIN D 25 HYDROXY (VIT D DEFICIENCY, FRACTURES): VITD: 21.3 ng/mL — AB (ref 30.00–100.00)

## 2017-01-29 ENCOUNTER — Ambulatory Visit (INDEPENDENT_AMBULATORY_CARE_PROVIDER_SITE_OTHER): Payer: Medicare Other | Admitting: Internal Medicine

## 2017-01-29 ENCOUNTER — Encounter: Payer: Self-pay | Admitting: Internal Medicine

## 2017-01-29 VITALS — BP 138/76 | HR 79 | Temp 98.6°F | Resp 14 | Ht 63.0 in | Wt 111.2 lb

## 2017-01-29 DIAGNOSIS — E039 Hypothyroidism, unspecified: Secondary | ICD-10-CM

## 2017-01-29 DIAGNOSIS — Z1231 Encounter for screening mammogram for malignant neoplasm of breast: Secondary | ICD-10-CM | POA: Diagnosis not present

## 2017-01-29 DIAGNOSIS — I10 Essential (primary) hypertension: Secondary | ICD-10-CM

## 2017-01-29 DIAGNOSIS — K219 Gastro-esophageal reflux disease without esophagitis: Secondary | ICD-10-CM | POA: Diagnosis not present

## 2017-01-29 DIAGNOSIS — E7800 Pure hypercholesterolemia, unspecified: Secondary | ICD-10-CM | POA: Diagnosis not present

## 2017-01-29 DIAGNOSIS — E559 Vitamin D deficiency, unspecified: Secondary | ICD-10-CM | POA: Diagnosis not present

## 2017-01-29 DIAGNOSIS — Z0000 Encounter for general adult medical examination without abnormal findings: Secondary | ICD-10-CM | POA: Diagnosis not present

## 2017-01-29 DIAGNOSIS — R634 Abnormal weight loss: Secondary | ICD-10-CM

## 2017-01-29 DIAGNOSIS — F439 Reaction to severe stress, unspecified: Secondary | ICD-10-CM

## 2017-01-29 DIAGNOSIS — M8000XD Age-related osteoporosis with current pathological fracture, unspecified site, subsequent encounter for fracture with routine healing: Secondary | ICD-10-CM

## 2017-01-29 DIAGNOSIS — Z1239 Encounter for other screening for malignant neoplasm of breast: Secondary | ICD-10-CM

## 2017-01-29 LAB — URINALYSIS, ROUTINE W REFLEX MICROSCOPIC
Bilirubin Urine: NEGATIVE
Hgb urine dipstick: NEGATIVE
LEUKOCYTES UA: NEGATIVE
Nitrite: NEGATIVE
RBC / HPF: NONE SEEN (ref 0–?)
SPECIFIC GRAVITY, URINE: 1.025 (ref 1.000–1.030)
TOTAL PROTEIN, URINE-UPE24: NEGATIVE
URINE GLUCOSE: NEGATIVE
UROBILINOGEN UA: 0.2 (ref 0.0–1.0)
pH: 5.5 (ref 5.0–8.0)
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
## 2017-01-29 NOTE — Progress Notes (Signed)
Pre-visit discussion using our clinic review tool. No additional management support is needed unless otherwise documented below in the visit note.  

## 2017-02-03 ENCOUNTER — Encounter: Payer: Self-pay | Admitting: Internal Medicine

## 2017-02-03 NOTE — Assessment & Plan Note (Signed)
01/28/17 - vitamin D level - 21.  Vitamin D supplements.

## 2017-02-03 NOTE — Assessment & Plan Note (Signed)
Physical today 01/29/17.  Mammogram 08/01/16 - Birads I.  Colonoscopy 2011.

## 2017-02-03 NOTE — Assessment & Plan Note (Signed)
Discussed bone density.  Discussed treatment options.  Agrees to reclast.  Form completed.

## 2017-02-03 NOTE — Assessment & Plan Note (Signed)
On pravastatin.  Follow lipid panel and liver function tests.   

## 2017-02-03 NOTE — Assessment & Plan Note (Signed)
Controlled on protonix.   

## 2017-02-03 NOTE — Assessment & Plan Note (Signed)
Blood pressure under good control.  Continue same medication regimen.  Follow pressures.  Follow metabolic panel.   

## 2017-02-03 NOTE — Assessment & Plan Note (Signed)
On thyroid replacement.  Follow tsh.  

## 2017-02-03 NOTE — Assessment & Plan Note (Signed)
States she is eating well.  Weight is stable.  Follow.

## 2017-02-03 NOTE — Assessment & Plan Note (Signed)
Discussed with her today.  Handling stress well.  Follow.

## 2017-04-27 ENCOUNTER — Other Ambulatory Visit: Payer: Self-pay | Admitting: Internal Medicine

## 2017-04-29 ENCOUNTER — Telehealth: Payer: Self-pay

## 2017-04-29 NOTE — Telephone Encounter (Signed)
Pharmacy requesting alternative medication for Telmisartan 80 mg. Not covered under insurance  Last ov 01/29/2017 Last refilled 04/27/2017

## 2017-04-30 MED ORDER — LOSARTAN POTASSIUM 100 MG PO TABS: 100.0000 mg | ORAL_TABLET | Freq: Every day | ORAL | 3 refills | Status: DC

## 2017-04-30 NOTE — Addendum Note (Signed)
Addended by: Nanci Pina on: 04/30/2017 09:50 AM   Modules accepted: Orders

## 2017-04-30 NOTE — Telephone Encounter (Signed)
Please call pt and let her know that we received notice from pharmacy that her insurance is no longer covering her micardis.  Confirm with her that she has not been on a blood pressure medication that she did not tolerate.  If no, then I would like to change her micardis 80mg  to losartan 100mg  q day.  Let me know if problems.  Will need to send in new rx.

## 2017-04-30 NOTE — Telephone Encounter (Signed)
Patient voiced to nurse she has never had any BP medications that she could not tolerate in the past, patient agreed to change to losartan 100 mg as PCP prescribed. Scripts sent to pharmacy as directed. FYI

## 2017-07-05 ENCOUNTER — Encounter: Payer: Self-pay | Admitting: Emergency Medicine

## 2017-07-05 ENCOUNTER — Emergency Department
Admission: EM | Admit: 2017-07-05 | Discharge: 2017-07-05 | Disposition: A | Discharge status: Home / Self Care | Payer: Medicare Other | Attending: Emergency Medicine | Admitting: Emergency Medicine

## 2017-07-05 ENCOUNTER — Emergency Department: Payer: Medicare Other

## 2017-07-05 DIAGNOSIS — I10 Essential (primary) hypertension: Secondary | ICD-10-CM | POA: Insufficient documentation

## 2017-07-05 DIAGNOSIS — Y9389 Activity, other specified: Secondary | ICD-10-CM | POA: Insufficient documentation

## 2017-07-05 DIAGNOSIS — Y9289 Other specified places as the place of occurrence of the external cause: Secondary | ICD-10-CM | POA: Insufficient documentation

## 2017-07-05 DIAGNOSIS — Y999 Unspecified external cause status: Secondary | ICD-10-CM | POA: Insufficient documentation

## 2017-07-05 DIAGNOSIS — S060X1A Concussion with loss of consciousness of 30 minutes or less, initial encounter: Secondary | ICD-10-CM | POA: Diagnosis not present

## 2017-07-05 DIAGNOSIS — E039 Hypothyroidism, unspecified: Secondary | ICD-10-CM | POA: Diagnosis not present

## 2017-07-05 DIAGNOSIS — S0990XA Unspecified injury of head, initial encounter: Secondary | ICD-10-CM | POA: Insufficient documentation

## 2017-07-05 DIAGNOSIS — S0101XA Laceration without foreign body of scalp, initial encounter: Secondary | ICD-10-CM | POA: Diagnosis not present

## 2017-07-05 DIAGNOSIS — W108XXA Fall (on) (from) other stairs and steps, initial encounter: Secondary | ICD-10-CM | POA: Diagnosis not present

## 2017-07-05 DIAGNOSIS — R51 Headache: Secondary | ICD-10-CM | POA: Diagnosis not present

## 2017-07-05 DIAGNOSIS — R55 Syncope and collapse: Secondary | ICD-10-CM | POA: Diagnosis not present

## 2017-07-05 DIAGNOSIS — W19XXXA Unspecified fall, initial encounter: Secondary | ICD-10-CM

## 2017-07-05 DIAGNOSIS — S0080XA Unspecified superficial injury of other part of head, initial encounter: Secondary | ICD-10-CM | POA: Diagnosis present

## 2017-07-05 LAB — CBC WITH DIFFERENTIAL/PLATELET
BASOS ABS: 0.1 10*3/uL (ref 0–0.1)
BASOS PCT: 1 %
EOS ABS: 0.1 10*3/uL (ref 0–0.7)
EOS PCT: 1 %
HEMATOCRIT: 44.1 % (ref 35.0–47.0)
Hemoglobin: 15.1 g/dL (ref 12.0–16.0)
LYMPHS ABS: 1.8 10*3/uL (ref 1.0–3.6)
Lymphocytes Relative: 19 %
MCH: 29.9 pg (ref 26.0–34.0)
MCHC: 34.2 g/dL (ref 32.0–36.0)
MCV: 87.3 fL (ref 80.0–100.0)
MONO ABS: 0.5 10*3/uL (ref 0.2–0.9)
Monocytes Relative: 5 %
NEUTROS ABS: 7 10*3/uL — AB (ref 1.4–6.5)
Neutrophils Relative %: 74 %
PLATELETS: 208 10*3/uL (ref 150–440)
RBC: 5.05 MIL/uL (ref 3.80–5.20)
RDW: 13.4 % (ref 11.5–14.5)
WBC: 9.4 10*3/uL (ref 3.6–11.0)

## 2017-07-05 LAB — COMPREHENSIVE METABOLIC PANEL
ALBUMIN: 4.2 g/dL (ref 3.5–5.0)
ALT: 12 U/L — ABNORMAL LOW (ref 14–54)
ANION GAP: 10 (ref 5–15)
AST: 28 U/L (ref 15–41)
Alkaline Phosphatase: 96 U/L (ref 38–126)
BILIRUBIN TOTAL: 0.6 mg/dL (ref 0.3–1.2)
BUN: 27 mg/dL — ABNORMAL HIGH (ref 6–20)
CHLORIDE: 107 mmol/L (ref 101–111)
CO2: 25 mmol/L (ref 22–32)
Calcium: 9.3 mg/dL (ref 8.9–10.3)
Creatinine, Ser: 0.7 mg/dL (ref 0.44–1.00)
GFR calc Af Amer: >60 mL/min (ref >60)
GLUCOSE: 107 mg/dL — AB (ref 65–99)
POTASSIUM: 4 mmol/L (ref 3.5–5.1)
Sodium: 142 mmol/L (ref 135–145)
TOTAL PROTEIN: 7.5 g/dL (ref 6.5–8.1)

## 2017-07-05 LAB — TROPONIN I: Troponin I: <0.03 ng/mL (ref <0.03)

## 2017-07-05 MED ORDER — ONDANSETRON 4 MG PO TBDP
4.0000 mg | ORAL_TABLET | Freq: Once | ORAL | Status: AC
  Administered 2017-07-05: 4 mg via ORAL
  Filled 2017-07-05: qty 1
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
## 2017-07-05 NOTE — ED Notes (Signed)
Patient transported to CT 

## 2017-07-05 NOTE — ED Notes (Signed)
Patient ambulated great with assistance.

## 2017-07-05 NOTE — ED Notes (Signed)
The EKG was completed and signed by Dr. McShane. The EKG was also exported into the system. 

## 2017-07-05 NOTE — ED Triage Notes (Signed)
Pt in via [facility] from home; pt reports taking some items down to the basement, slipping and falling down approximately 7 steps.  Pt with laceration and hematoma to right forehead, skin tears to bilateral elbows.  Pt arrives in C-collar.  Pt with syncopal episode after the fall per EMS.  Pt A/Ox4, denies any pain, NAD noted at this time.

## 2017-07-31 ENCOUNTER — Other Ambulatory Visit: Payer: Self-pay | Admitting: Internal Medicine

## 2017-08-08 ENCOUNTER — Ambulatory Visit (INDEPENDENT_AMBULATORY_CARE_PROVIDER_SITE_OTHER): Payer: Medicare Other | Admitting: Internal Medicine

## 2017-08-08 ENCOUNTER — Encounter: Payer: Self-pay | Admitting: Internal Medicine

## 2017-08-08 VITALS — BP 136/74 | HR 81 | Temp 98.6°F | Resp 14 | Ht 63.0 in | Wt 112.8 lb

## 2017-08-08 DIAGNOSIS — I10 Essential (primary) hypertension: Secondary | ICD-10-CM | POA: Diagnosis not present

## 2017-08-08 DIAGNOSIS — Z23 Encounter for immunization: Secondary | ICD-10-CM | POA: Diagnosis not present

## 2017-08-08 DIAGNOSIS — E039 Hypothyroidism, unspecified: Secondary | ICD-10-CM | POA: Diagnosis not present

## 2017-08-08 DIAGNOSIS — M8000XD Age-related osteoporosis with current pathological fracture, unspecified site, subsequent encounter for fracture with routine healing: Secondary | ICD-10-CM | POA: Diagnosis not present

## 2017-08-08 DIAGNOSIS — W19XXXD Unspecified fall, subsequent encounter: Secondary | ICD-10-CM

## 2017-08-08 DIAGNOSIS — E559 Vitamin D deficiency, unspecified: Secondary | ICD-10-CM

## 2017-08-08 DIAGNOSIS — K219 Gastro-esophageal reflux disease without esophagitis: Secondary | ICD-10-CM

## 2017-08-08 DIAGNOSIS — E7800 Pure hypercholesterolemia, unspecified: Secondary | ICD-10-CM

## 2017-08-08 DIAGNOSIS — F439 Reaction to severe stress, unspecified: Secondary | ICD-10-CM | POA: Diagnosis not present

## 2017-08-08 LAB — BASIC METABOLIC PANEL
BUN: 24 mg/dL — ABNORMAL HIGH (ref 6–23)
CALCIUM: 9.3 mg/dL (ref 8.4–10.5)
CO2: 30 mEq/L (ref 19–32)
Chloride: 103 mEq/L (ref 96–112)
Creatinine, Ser: 0.71 mg/dL (ref 0.40–1.20)
GFR: 83.66 mL/min (ref >60.00)
GLUCOSE: 91 mg/dL (ref 70–99)
Potassium: 4.3 mEq/L (ref 3.5–5.1)
SODIUM: 140 meq/L (ref 135–145)

## 2017-08-08 LAB — LIPID PANEL
CHOLESTEROL: 222 mg/dL — AB (ref 0–200)
HDL: 87.4 mg/dL (ref >39.00)
LDL CALC: 124 mg/dL — AB (ref 0–99)
NonHDL: 134.98
TRIGLYCERIDES: 57 mg/dL (ref 0.0–149.0)
Total CHOL/HDL Ratio: 3
VLDL: 11.4 mg/dL (ref 0.0–40.0)
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
## 2017-08-11 ENCOUNTER — Encounter: Payer: Self-pay | Admitting: Internal Medicine

## 2017-08-11 NOTE — Assessment & Plan Note (Signed)
Controlled on protonix.   

## 2017-08-11 NOTE — Assessment & Plan Note (Signed)
Blood pressure under good control.  Continue same medication regimen.  Follow pressures.  Follow metabolic panel.   

## 2017-08-11 NOTE — Assessment & Plan Note (Signed)
Follow vitamin D level.  

## 2017-08-11 NOTE — Assessment & Plan Note (Signed)
On pravastatin.  Follow lipid panel and liver function tests.   

## 2017-08-11 NOTE — Assessment & Plan Note (Signed)
Reclast.  They have contacted her.  She will call to schedule.

## 2017-08-11 NOTE — Assessment & Plan Note (Signed)
Doing better.  Sister passed away. She is handling this well.  Follow.

## 2017-08-11 NOTE — Assessment & Plan Note (Signed)
On thyroid replacement.  Follow tsh.  

## 2017-09-19 ENCOUNTER — Ambulatory Visit
Admission: RE | Admit: 2017-09-19 | Discharge: 2017-09-19 | Disposition: A | Discharge status: Home / Self Care | Payer: Medicare Other | Source: Ambulatory Visit | Attending: Internal Medicine | Admitting: Internal Medicine

## 2017-09-19 DIAGNOSIS — Z1231 Encounter for screening mammogram for malignant neoplasm of breast: Secondary | ICD-10-CM | POA: Diagnosis not present

## 2017-09-19 DIAGNOSIS — Z1239 Encounter for other screening for malignant neoplasm of breast: Secondary | ICD-10-CM

## 2017-10-23 ENCOUNTER — Ambulatory Visit: Payer: Medicare HMO

## 2017-11-15 ENCOUNTER — Ambulatory Visit (INDEPENDENT_AMBULATORY_CARE_PROVIDER_SITE_OTHER): Payer: Medicare Other | Admitting: Internal Medicine

## 2017-11-15 ENCOUNTER — Encounter: Payer: Self-pay | Admitting: Internal Medicine

## 2017-11-15 VITALS — BP 138/56 | HR 80 | Temp 98.5°F | Ht 63.0 in | Wt 114.4 lb

## 2017-11-15 DIAGNOSIS — I4892 Unspecified atrial flutter: Secondary | ICD-10-CM | POA: Diagnosis not present

## 2017-11-15 DIAGNOSIS — E559 Vitamin D deficiency, unspecified: Secondary | ICD-10-CM | POA: Diagnosis not present

## 2017-11-15 DIAGNOSIS — R0602 Shortness of breath: Secondary | ICD-10-CM | POA: Diagnosis not present

## 2017-11-15 DIAGNOSIS — K219 Gastro-esophageal reflux disease without esophagitis: Secondary | ICD-10-CM

## 2017-11-15 DIAGNOSIS — E782 Mixed hyperlipidemia: Secondary | ICD-10-CM | POA: Diagnosis not present

## 2017-11-15 DIAGNOSIS — I498 Other specified cardiac arrhythmias: Secondary | ICD-10-CM

## 2017-11-15 DIAGNOSIS — M8000XD Age-related osteoporosis with current pathological fracture, unspecified site, subsequent encounter for fracture with routine healing: Secondary | ICD-10-CM

## 2017-11-15 DIAGNOSIS — F439 Reaction to severe stress, unspecified: Secondary | ICD-10-CM | POA: Diagnosis not present

## 2017-11-15 DIAGNOSIS — E039 Hypothyroidism, unspecified: Secondary | ICD-10-CM | POA: Diagnosis not present

## 2017-11-15 DIAGNOSIS — I10 Essential (primary) hypertension: Secondary | ICD-10-CM

## 2017-11-15 DIAGNOSIS — E7800 Pure hypercholesterolemia, unspecified: Secondary | ICD-10-CM

## 2017-11-15 DIAGNOSIS — R002 Palpitations: Secondary | ICD-10-CM | POA: Diagnosis not present

## 2017-11-15 DIAGNOSIS — I6523 Occlusion and stenosis of bilateral carotid arteries: Secondary | ICD-10-CM | POA: Diagnosis not present

## 2017-11-15 LAB — CBC WITH DIFFERENTIAL/PLATELET
BASOS ABS: 0.1 10*3/uL (ref 0.0–0.1)
BASOS PCT: 1 % (ref 0.0–3.0)
EOS ABS: 0.1 10*3/uL (ref 0.0–0.7)
Eosinophils Relative: 1.5 % (ref 0.0–5.0)
HEMATOCRIT: 44.5 % (ref 36.0–46.0)
HEMOGLOBIN: 14.6 g/dL (ref 12.0–15.0)
LYMPHS PCT: 33.7 % (ref 12.0–46.0)
Lymphs Abs: 1.7 10*3/uL (ref 0.7–4.0)
MCHC: 32.8 g/dL (ref 30.0–36.0)
MCV: 90.2 fl (ref 78.0–100.0)
MONOS PCT: 9.5 % (ref 3.0–12.0)
Monocytes Absolute: 0.5 10*3/uL (ref 0.1–1.0)
NEUTROS ABS: 2.8 10*3/uL (ref 1.4–7.7)
Neutrophils Relative %: 54.3 % (ref 43.0–77.0)
PLATELETS: 284 10*3/uL (ref 150.0–400.0)
RBC: 4.93 Mil/uL (ref 3.87–5.11)
RDW: 13.4 % (ref 11.5–15.5)
WBC: 5.2 10*3/uL (ref 4.0–10.5)

## 2017-11-15 LAB — HEPATIC FUNCTION PANEL
ALT: 10 U/L (ref 0–35)
AST: 19 U/L (ref 0–37)
Albumin: 4.2 g/dL (ref 3.5–5.2)
Alkaline Phosphatase: 94 U/L (ref 39–117)
Bilirubin, Direct: 0.1 mg/dL (ref 0.0–0.3)
TOTAL PROTEIN: 6.9 g/dL (ref 6.0–8.3)
Total Bilirubin: 0.7 mg/dL (ref 0.2–1.2)

## 2017-11-15 LAB — BASIC METABOLIC PANEL
BUN: 22 mg/dL (ref 6–23)
CALCIUM: 9.3 mg/dL (ref 8.4–10.5)
CO2: 29 mEq/L (ref 19–32)
Chloride: 103 mEq/L (ref 96–112)
Creatinine, Ser: 0.78 mg/dL (ref 0.40–1.20)
GFR: 75 mL/min (ref >60.00)
Glucose, Bld: 90 mg/dL (ref 70–99)
POTASSIUM: 4.2 meq/L (ref 3.5–5.1)
SODIUM: 141 meq/L (ref 135–145)

## 2017-11-15 LAB — LIPID PANEL
CHOL/HDL RATIO: 3
CHOLESTEROL: 248 mg/dL — AB (ref 0–200)
HDL: 94 mg/dL (ref >39.00)
LDL CALC: 140 mg/dL — AB (ref 0–99)
NonHDL: 153.73
TRIGLYCERIDES: 69 mg/dL (ref 0.0–149.0)
VLDL: 13.8 mg/dL (ref 0.0–40.0)

## 2017-11-15 LAB — TSH: TSH: 2 u[IU]/mL (ref 0.35–4.50)

## 2017-11-15 LAB — VITAMIN D 25 HYDROXY (VIT D DEFICIENCY, FRACTURES): VITD: 32.69 ng/mL (ref 30.00–100.00)

## 2017-11-15 NOTE — Progress Notes (Signed)
Pre visit review using our clinic review tool, if applicable. No additional management support is needed unless otherwise documented below in the visit note. 
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
## 2017-11-17 ENCOUNTER — Encounter: Payer: Self-pay | Admitting: Internal Medicine

## 2017-11-17 DIAGNOSIS — R0602 Shortness of breath: Secondary | ICD-10-CM | POA: Insufficient documentation

## 2017-11-17 NOTE — Assessment & Plan Note (Signed)
On thyroid replacement.  Follow tsh.  

## 2017-11-17 NOTE — Assessment & Plan Note (Signed)
Controlled on protonix.   

## 2017-11-17 NOTE — Assessment & Plan Note (Signed)
Handling stress relatively well.  Follow.

## 2017-11-17 NOTE — Assessment & Plan Note (Signed)
Reclast.  

## 2017-11-17 NOTE — Assessment & Plan Note (Signed)
Blood pressure as outlined.  Continue same medication regimen.  Follow pressures.  Follow metabolic panel.  

## 2017-11-17 NOTE — Assessment & Plan Note (Signed)
On vitamin D supplements.  Follow vitamin D level.

## 2017-11-17 NOTE — Assessment & Plan Note (Signed)
SOB with exertion.  Chest discomfort as outlined.  Increased heart rate and palpitations as outlined.  EKG - initial - question of aflutter/afib.  F/u EKG - SR with no acute ischemic changes when compared to previous.  Given symptoms, question of aflutter and EKG changes, refer to cardiology for further evaluation and testing.  Pt agreeable.  To see Dr Nehemiah Massed today.

## 2017-11-17 NOTE — Assessment & Plan Note (Signed)
On pravastatin.  Low cholesterol diet and exercise.  Follow lipid panel and liver function tests.   

## 2017-12-04 DIAGNOSIS — R0602 Shortness of breath: Secondary | ICD-10-CM | POA: Diagnosis not present

## 2017-12-16 DIAGNOSIS — I6523 Occlusion and stenosis of bilateral carotid arteries: Secondary | ICD-10-CM | POA: Diagnosis not present

## 2017-12-16 DIAGNOSIS — I10 Essential (primary) hypertension: Secondary | ICD-10-CM | POA: Diagnosis not present

## 2017-12-16 DIAGNOSIS — R002 Palpitations: Secondary | ICD-10-CM | POA: Diagnosis not present

## 2017-12-16 DIAGNOSIS — R0602 Shortness of breath: Secondary | ICD-10-CM | POA: Diagnosis not present

## 2017-12-16 DIAGNOSIS — E782 Mixed hyperlipidemia: Secondary | ICD-10-CM | POA: Diagnosis not present

## 2017-12-18 DIAGNOSIS — R002 Palpitations: Secondary | ICD-10-CM | POA: Diagnosis not present

## 2017-12-20 ENCOUNTER — Encounter: Payer: Self-pay | Admitting: Internal Medicine

## 2017-12-20 ENCOUNTER — Ambulatory Visit (INDEPENDENT_AMBULATORY_CARE_PROVIDER_SITE_OTHER): Payer: Medicare Other | Admitting: Internal Medicine

## 2017-12-20 DIAGNOSIS — E559 Vitamin D deficiency, unspecified: Secondary | ICD-10-CM

## 2017-12-20 DIAGNOSIS — F439 Reaction to severe stress, unspecified: Secondary | ICD-10-CM | POA: Diagnosis not present

## 2017-12-20 DIAGNOSIS — E039 Hypothyroidism, unspecified: Secondary | ICD-10-CM

## 2017-12-20 DIAGNOSIS — I10 Essential (primary) hypertension: Secondary | ICD-10-CM | POA: Diagnosis not present

## 2017-12-20 DIAGNOSIS — R0602 Shortness of breath: Secondary | ICD-10-CM | POA: Diagnosis not present

## 2017-12-20 DIAGNOSIS — K219 Gastro-esophageal reflux disease without esophagitis: Secondary | ICD-10-CM | POA: Diagnosis not present

## 2017-12-20 DIAGNOSIS — E7800 Pure hypercholesterolemia, unspecified: Secondary | ICD-10-CM

## 2017-12-20 MED ORDER — PRAVASTATIN SODIUM 40 MG PO TABS: 40.0000 mg | ORAL_TABLET | Freq: Every day | ORAL | 1 refills | Status: DC
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
## 2017-12-23 ENCOUNTER — Encounter: Payer: Self-pay | Admitting: Internal Medicine

## 2017-12-23 NOTE — Assessment & Plan Note (Signed)
Noticed with exertion.  Cardiology w/up as outlined.  No changes made in medication.  No further cardiac w/up warranted.  Follow.

## 2017-12-23 NOTE — Assessment & Plan Note (Signed)
Blood pressure on recheck revealed diastic in the 80s.  Continue same medication regimen.  Follow pressures.  Follow metabolic panel.

## 2017-12-23 NOTE — Assessment & Plan Note (Signed)
On thyroid replacement.  Follow tsh.  

## 2017-12-23 NOTE — Assessment & Plan Note (Signed)
Follow vitamin D level.  

## 2017-12-23 NOTE — Assessment & Plan Note (Signed)
On pravastatin.  Just recently increased to 40mg  q day.  Follow lipid panel and liver function tests.

## 2017-12-23 NOTE — Assessment & Plan Note (Signed)
Handling stress relatively well.

## 2017-12-23 NOTE — Assessment & Plan Note (Signed)
Controlled on current regimen.  Follow.  

## 2017-12-30 DIAGNOSIS — Z961 Presence of intraocular lens: Secondary | ICD-10-CM | POA: Diagnosis not present

## 2018-01-13 DIAGNOSIS — I10 Essential (primary) hypertension: Secondary | ICD-10-CM | POA: Diagnosis not present

## 2018-01-13 DIAGNOSIS — E782 Mixed hyperlipidemia: Secondary | ICD-10-CM | POA: Diagnosis not present

## 2018-01-13 DIAGNOSIS — R002 Palpitations: Secondary | ICD-10-CM | POA: Diagnosis not present

## 2018-01-13 DIAGNOSIS — I471 Supraventricular tachycardia: Secondary | ICD-10-CM | POA: Diagnosis not present

## 2018-03-20 ENCOUNTER — Other Ambulatory Visit (INDEPENDENT_AMBULATORY_CARE_PROVIDER_SITE_OTHER): Payer: Medicare Other

## 2018-03-20 DIAGNOSIS — E7800 Pure hypercholesterolemia, unspecified: Secondary | ICD-10-CM | POA: Diagnosis not present

## 2018-03-20 DIAGNOSIS — I10 Essential (primary) hypertension: Secondary | ICD-10-CM | POA: Diagnosis not present

## 2018-03-20 LAB — HEPATIC FUNCTION PANEL
ALBUMIN: 4 g/dL (ref 3.5–5.2)
ALK PHOS: 78 U/L (ref 39–117)
ALT: 7 U/L (ref 0–35)
AST: 17 U/L (ref 0–37)
BILIRUBIN TOTAL: 0.9 mg/dL (ref 0.2–1.2)
Bilirubin, Direct: 0.2 mg/dL (ref 0.0–0.3)
Total Protein: 6.9 g/dL (ref 6.0–8.3)

## 2018-03-20 LAB — BASIC METABOLIC PANEL
BUN: 20 mg/dL (ref 6–23)
CALCIUM: 8.8 mg/dL (ref 8.4–10.5)
CO2: 26 meq/L (ref 19–32)
Chloride: 105 mEq/L (ref 96–112)
Creatinine, Ser: 0.78 mg/dL (ref 0.40–1.20)
GFR: 74.94 mL/min (ref >60.00)
GLUCOSE: 96 mg/dL (ref 70–99)
Potassium: 3.6 mEq/L (ref 3.5–5.1)
SODIUM: 139 meq/L (ref 135–145)

## 2018-03-20 LAB — LIPID PANEL
CHOL/HDL RATIO: 2
Cholesterol: 202 mg/dL — ABNORMAL HIGH (ref 0–200)
HDL: 83.9 mg/dL (ref >39.00)
LDL Cholesterol: 108 mg/dL — ABNORMAL HIGH (ref 0–99)
NONHDL: 117.79
Triglycerides: 50 mg/dL (ref 0.0–149.0)
VLDL: 10 mg/dL (ref 0.0–40.0)

## 2018-03-24 ENCOUNTER — Encounter: Payer: Self-pay | Admitting: Internal Medicine

## 2018-03-24 ENCOUNTER — Ambulatory Visit (INDEPENDENT_AMBULATORY_CARE_PROVIDER_SITE_OTHER): Payer: Medicare Other | Admitting: Internal Medicine

## 2018-03-24 DIAGNOSIS — K219 Gastro-esophageal reflux disease without esophagitis: Secondary | ICD-10-CM | POA: Diagnosis not present

## 2018-03-24 DIAGNOSIS — E7800 Pure hypercholesterolemia, unspecified: Secondary | ICD-10-CM

## 2018-03-24 DIAGNOSIS — E039 Hypothyroidism, unspecified: Secondary | ICD-10-CM

## 2018-03-24 DIAGNOSIS — E559 Vitamin D deficiency, unspecified: Secondary | ICD-10-CM | POA: Diagnosis not present

## 2018-03-24 DIAGNOSIS — I10 Essential (primary) hypertension: Secondary | ICD-10-CM
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
## 2018-03-25 ENCOUNTER — Encounter: Payer: Self-pay | Admitting: Internal Medicine

## 2018-03-25 NOTE — Assessment & Plan Note (Signed)
Controlled on current regimen.  Follow.  

## 2018-03-25 NOTE — Assessment & Plan Note (Signed)
Follow vitamin D level.  

## 2018-03-25 NOTE — Assessment & Plan Note (Signed)
On 12/ pravastatin (40mg ) now.  Recent lipid panel improved.  Had intolerance to higher dose.  Follow.   Lab Results  Component Value Date   CHOL 202 (H) 03/20/2018   HDL 83.90 03/20/2018   LDLCALC 108 (H) 03/20/2018   LDLDIRECT 105.6 11/26/2013   TRIG 50.0 03/20/2018   CHOLHDL 2 03/20/2018

## 2018-03-25 NOTE — Assessment & Plan Note (Signed)
Blood pressure as outlined.  Same medication regimen.  Follow pressures.  Follow metabolic panel.  

## 2018-03-25 NOTE — Assessment & Plan Note (Signed)
On thyroid replacement.  Follow tsh.  

## 2018-04-28 ENCOUNTER — Other Ambulatory Visit: Payer: Self-pay | Admitting: Internal Medicine

## 2018-05-19 DIAGNOSIS — E782 Mixed hyperlipidemia: Secondary | ICD-10-CM | POA: Diagnosis not present

## 2018-05-19 DIAGNOSIS — R0602 Shortness of breath: Secondary | ICD-10-CM | POA: Diagnosis not present

## 2018-05-19 DIAGNOSIS — I10 Essential (primary) hypertension: Secondary | ICD-10-CM | POA: Diagnosis not present

## 2018-05-28 ENCOUNTER — Ambulatory Visit (INDEPENDENT_AMBULATORY_CARE_PROVIDER_SITE_OTHER): Payer: Medicare Other

## 2018-05-28 VITALS — BP 118/64 | HR 96 | Temp 98.2°F | Resp 14 | Ht 63.5 in | Wt 115.4 lb

## 2018-05-28 DIAGNOSIS — Z0000 Encounter for general adult medical examination without abnormal findings: Secondary | ICD-10-CM | POA: Diagnosis not present

## 2018-05-28 NOTE — Patient Instructions (Addendum)
  Ms. Strack , Thank you for taking time to come for your Medicare Wellness Visit. I appreciate your ongoing commitment to your health goals. Please review the following plan we discussed and let me know if I can assist you in the future.   Follow up as needed.    Bring a copy of your [place] and/or Living Will to be scanned into chart.  Bring blood pressure log to your next appointment with Dr. Nicki Reaper.   Have a great day!  These are the goals we discussed: Goals    . Maintain Healthy Lifestyle      Stay active, exercise Stay hydrated Healthy diet       This is a list of the screening recommended for you and due dates:  Health Maintenance  Topic Date Due  . Tetanus Vaccine  01/30/1954  . Flu Shot  06/12/2018  . Mammogram  09/19/2018  . DEXA scan (bone density measurement)  Completed  . Pneumonia vaccines  Completed
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
## 2018-06-14 ENCOUNTER — Other Ambulatory Visit: Payer: Self-pay | Admitting: Internal Medicine

## 2018-07-24 ENCOUNTER — Other Ambulatory Visit (INDEPENDENT_AMBULATORY_CARE_PROVIDER_SITE_OTHER): Payer: Medicare Other

## 2018-07-24 DIAGNOSIS — I10 Essential (primary) hypertension: Secondary | ICD-10-CM | POA: Diagnosis not present

## 2018-07-24 DIAGNOSIS — E7800 Pure hypercholesterolemia, unspecified: Secondary | ICD-10-CM

## 2018-07-24 LAB — LIPID PANEL
CHOL/HDL RATIO: 3
Cholesterol: 223 mg/dL — ABNORMAL HIGH (ref 0–200)
HDL: 79.9 mg/dL (ref >39.00)
LDL Cholesterol: 130 mg/dL — ABNORMAL HIGH (ref 0–99)
NONHDL: 142.91
Triglycerides: 65 mg/dL (ref 0.0–149.0)
VLDL: 13 mg/dL (ref 0.0–40.0)

## 2018-07-24 LAB — HEPATIC FUNCTION PANEL
ALBUMIN: 4.1 g/dL (ref 3.5–5.2)
ALT: 9 U/L (ref 0–35)
AST: 16 U/L (ref 0–37)
Alkaline Phosphatase: 82 U/L (ref 39–117)
Bilirubin, Direct: 0.1 mg/dL (ref 0.0–0.3)
Total Bilirubin: 0.7 mg/dL (ref 0.2–1.2)
Total Protein: 7.1 g/dL (ref 6.0–8.3)

## 2018-07-24 LAB — BASIC METABOLIC PANEL
BUN: 19 mg/dL (ref 6–23)
CALCIUM: 9.1 mg/dL (ref 8.4–10.5)
CO2: 29 meq/L (ref 19–32)
Chloride: 103 mEq/L (ref 96–112)
Creatinine, Ser: 0.89 mg/dL (ref 0.40–1.20)
GFR: 64.3 mL/min (ref >60.00)
GLUCOSE: 103 mg/dL — AB (ref 70–99)
POTASSIUM: 4.4 meq/L (ref 3.5–5.1)
SODIUM: 139 meq/L (ref 135–145)

## 2018-07-25 ENCOUNTER — Other Ambulatory Visit: Payer: Self-pay | Admitting: Internal Medicine

## 2018-07-29 ENCOUNTER — Ambulatory Visit (INDEPENDENT_AMBULATORY_CARE_PROVIDER_SITE_OTHER): Payer: Medicare Other | Admitting: Internal Medicine

## 2018-07-29 ENCOUNTER — Encounter: Payer: Self-pay | Admitting: Internal Medicine

## 2018-07-29 ENCOUNTER — Ambulatory Visit (INDEPENDENT_AMBULATORY_CARE_PROVIDER_SITE_OTHER): Payer: Medicare Other

## 2018-07-29 VITALS — BP 138/78 | HR 67 | Temp 98.3°F | Resp 18 | Wt 114.0 lb

## 2018-07-29 DIAGNOSIS — K219 Gastro-esophageal reflux disease without esophagitis: Secondary | ICD-10-CM

## 2018-07-29 DIAGNOSIS — F439 Reaction to severe stress, unspecified: Secondary | ICD-10-CM | POA: Diagnosis not present

## 2018-07-29 DIAGNOSIS — Z23 Encounter for immunization: Secondary | ICD-10-CM

## 2018-07-29 DIAGNOSIS — R0602 Shortness of breath: Secondary | ICD-10-CM

## 2018-07-29 DIAGNOSIS — I10 Essential (primary) hypertension: Secondary | ICD-10-CM

## 2018-07-29 DIAGNOSIS — R42 Dizziness and giddiness: Secondary | ICD-10-CM

## 2018-07-29 DIAGNOSIS — E039 Hypothyroidism, unspecified: Secondary | ICD-10-CM

## 2018-07-29 DIAGNOSIS — E7800 Pure hypercholesterolemia, unspecified: Secondary | ICD-10-CM | POA: Diagnosis not present

## 2018-07-29 DIAGNOSIS — E559 Vitamin D deficiency, unspecified: Secondary | ICD-10-CM | POA: Diagnosis not present
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
## 2018-07-29 NOTE — Progress Notes (Signed)
[name], female   DOB: 06/24/1933, 85 y.o.   MRN: [id number]  

## 2018-07-31 ENCOUNTER — Other Ambulatory Visit: Payer: Self-pay | Admitting: Internal Medicine

## 2018-07-31 DIAGNOSIS — R0602 Shortness of breath: Secondary | ICD-10-CM

## 2018-07-31 NOTE — Progress Notes (Signed)
Order placed for pulmonary referral.  

## 2018-08-03 ENCOUNTER — Encounter: Payer: Self-pay | Admitting: Internal Medicine

## 2018-08-03 DIAGNOSIS — R42 Dizziness and giddiness: Secondary | ICD-10-CM | POA: Insufficient documentation

## 2018-08-03 NOTE — Assessment & Plan Note (Signed)
Discussed with her today.  Overall she feels she is handling things relatively well.  Follow.   

## 2018-08-03 NOTE — Assessment & Plan Note (Signed)
On thyroid replacement.  Follow tsh.  

## 2018-08-03 NOTE — Assessment & Plan Note (Signed)
Follow vitamin D level.  

## 2018-08-03 NOTE — Assessment & Plan Note (Signed)
Some light headedness this am.  Resolved.  She feels related to rushing around.  Discussed further w/up and evaluation.  She declines.  Will notify me if persistent problems or change in symptoms.

## 2018-08-03 NOTE — Assessment & Plan Note (Signed)
Controlled on current regimen.  Follow.  

## 2018-08-03 NOTE — Assessment & Plan Note (Signed)
On pravastatin 20mg  q day now.  Unable to tolerate increased dose.  Low cholesterol diet and exercise.  Follow lipid panel and liver function tests.   Lab Results  Component Value Date   CHOL 223 (H) 07/24/2018   HDL 79.90 07/24/2018   LDLCALC 130 (H) 07/24/2018   LDLDIRECT 105.6 11/26/2013   TRIG 65.0 07/24/2018   CHOLHDL 3 07/24/2018

## 2018-08-03 NOTE — Assessment & Plan Note (Signed)
Blood pressure as outlined.  Follow pressures.  Follow metabolic panel.   

## 2018-08-03 NOTE — Assessment & Plan Note (Signed)
Saw cardiology.  W/up and notes reviewed.  No further cardiac w/up warranted.  Given persistent sob with exertion, will check cxr.
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
## 2018-08-07 ENCOUNTER — Encounter: Payer: Self-pay | Admitting: Internal Medicine

## 2018-08-07 ENCOUNTER — Ambulatory Visit (INDEPENDENT_AMBULATORY_CARE_PROVIDER_SITE_OTHER): Payer: Medicare Other | Admitting: Internal Medicine

## 2018-08-07 VITALS — BP 140/100 | HR 91 | Ht 63.5 in

## 2018-08-07 DIAGNOSIS — J449 Chronic obstructive pulmonary disease, unspecified: Secondary | ICD-10-CM

## 2018-08-07 DIAGNOSIS — R0609 Other forms of dyspnea: Secondary | ICD-10-CM | POA: Diagnosis not present

## 2018-08-07 MED ORDER — UMECLIDINIUM-VILANTEROL 62.5-25 MCG/INH IN AEPB: 1.0000 | INHALATION_SPRAY | Freq: Every day | RESPIRATORY_TRACT | 0 refills | Status: DC

## 2018-08-07 NOTE — Patient Instructions (Addendum)
Will start Anoro inhaler once daily to see if this helps your breathing.  Continue exercise.  Will send for lung function test.

## 2018-08-21 ENCOUNTER — Ambulatory Visit: Payer: Medicare Other | Attending: Internal Medicine

## 2018-08-21 DIAGNOSIS — J449 Chronic obstructive pulmonary disease, unspecified: Secondary | ICD-10-CM | POA: Diagnosis not present

## 2018-08-21 MED ORDER — ALBUTEROL SULFATE (2.5 MG/3ML) 0.083% IN NEBU
2.5000 mg | INHALATION_SOLUTION | Freq: Once | RESPIRATORY_TRACT | Status: AC
  Administered 2018-08-21: 2.5 mg via RESPIRATORY_TRACT
  Filled 2018-08-21: qty 3

## 2018-08-27 ENCOUNTER — Ambulatory Visit: Payer: Medicare Other | Admitting: Internal Medicine

## 2018-09-08 NOTE — Progress Notes (Signed)
[facility] Consultation      Assessment and Plan:  Chronic bronchitis with Dyspnea on exertion.  - She is doing better with Anoro inhaler with normal PFT, consistent with chronic bronchitis.  Hyperinflation consistent with emphysema is noted on imaging, however this is not seen on lung function testing. - Continue Anoro daily.  Right middle lobe scarring, right hilar adenopathy. - Recent chest x-ray imaging shows a right middle lobe scarring and right hilar adenopathy.  Had a long discussion with the patient in regards to whether we should follow this up.  She is otherwise fairly healthy despite her age, she has had multiple family members with cancer. - Will send for CT chest.   Orders Placed This Encounter  Procedures  . CT CHEST W CONTRAST   Return in about 5 weeks (around 10/14/2018).    Date: 09/08/2018  MRN# [id number] Kaitlyn Bowen 1933-10-05    Kaitlyn Bowen is a 84 y.o. old female seen in consultation for chief complaint of:    Chief Complaint  Patient presents with  . COPD    pt here to f/u with results of PFT:  . Shortness of Breath    pt states Anoro has help with sob: much improved.    HPI:  She has been having dyspnea for several years, and it has been progressive. She gets winded with mild activity.  Today she notes that her breathing has improved, she continues to take it once per day. Her PFT was reviewed with her today and appears normal.   **PFT 08/21/2018>> tracings were personally reviewed.  FVC is 98% predicted, FEV1 is 108% predicted, ratio is 83%.  There is no significant improvement by the dilator therapy.  Flow volume loop is unremarkable. TLC is 87% reviewed, RV to TLC ratio is normal.  DLCO is 86%. -Overall this test shows normal pulmonary functions without evidence of obstructive lung disease. **Desat walk 08/07/18>>Desat walk at rest on RA sat was 98% and HR 78. Pt walked 360 feet at brisk pace and conversational.  Minimal dyspnea. End walk sat 98% and HR 89.  **Chest x-ray 07/29/2018>> imaging personally reviewed, chest x-ray shows hyperinflation consistent with emphysema.  There is mild right middle lobe scarring/infiltrate, right sided hilar adenopathy. **CT chest 08/20/2014>> normal lungs, some mild posterior bilateral pleural-based tiny nodules versus pleural thickening. **CBC 11/15/17>>Abs eos 100.   Medication:    Current Outpatient Medications:  .  amLODipine (NORVASC) 5 MG tablet, TAKE 1 TABLET BY MOUTH EVERY DAY, Disp: 90 tablet, Rfl: 1 .  aspirin 81 MG tablet, Take 81 mg by mouth daily., Disp: , Rfl:  .  Calcium Carbonate-Vit D-Min 600-400 MG-UNIT TABS, Take by mouth 2 (two) times daily. Take 1 tablet by mouth twice a day, Disp: , Rfl:  .  carbidopa-levodopa (SINEMET IR) 10-100 MG tablet, TAKE 1 TABLET BY MOUTH AT BEDTIME, Disp: 90 tablet, Rfl: 3 .  Cholecalciferol (VITAMIN D3) 2000 UNITS capsule, Take 2,000 Units by mouth daily., Disp: , Rfl:  .  levothyroxine (SYNTHROID, LEVOTHROID) 75 MCG tablet, TAKE 1 TABLET BY MOUTH EVERY DAY, Disp: 90 tablet, Rfl: 3 .  losartan (COZAAR) 100 MG tablet, TAKE 1 TABLET BY MOUTH EVERY DAY, Disp: 90 tablet, Rfl: 3 .  pantoprazole (PROTONIX) 40 MG tablet, TAKE 1 TABLET BY MOUTH EVERY DAY, Disp: 90 tablet, Rfl: 1 .  pravastatin (PRAVACHOL) 20 MG tablet, Take 20 mg by mouth daily., Disp: , Rfl:  .  umeclidinium-vilanterol (ANORO ELLIPTA) 62.5-25 MCG/INH AEPB,  Inhale 1 puff into the lungs daily., Disp: 12 each, Rfl: 0 .  Vitamin D, Ergocalciferol, (DRISDOL) 50000 units CAPS capsule, TAKE 1 CAPSULE (50,000 UNITS TOTAL) BY MOUTH ONCE A WEEK., Disp: 4 capsule, Rfl: 2   Allergies:  Patient has no allergy information on record.   Review of Systems:  Constitutional: Feels well. Cardiovascular: Denies chest pain, exertional chest pain.  Pulmonary: Denies hemoptysis, pleuritic chest pain.   The remainder of systems were reviewed and were found to be negative other than  what is documented in the HPI.    Physical Examination:   VS: BP 128/84 (BP Location: Left Arm, Cuff Size: Normal)   Pulse 80   Resp 16   Ht 5' 3.5" (1.613 m)   Wt 116 lb (52.6 kg)   LMP 10/23/1967   SpO2 99%   BMI 20.23 kg/m   General Appearance: No distress  Neuro:without focal findings, mental status, speech normal, alert and oriented HEENT: PERRLA, EOM intact Pulmonary: No wheezing, No rales  CardiovascularNormal S1,S2.  No m/r/g.  Abdomen: Benign, Soft, non-tender, No masses Renal:  No costovertebral tenderness  GU:  No performed at this time. Endoc: No evident thyromegaly, no signs of acromegaly or Cushing features Skin:   warm, no rashes, no ecchymosis  Extremities: normal, no cyanosis, clubbing.    LABORATORY PANEL:   CBC No results for input(s): WBC, HGB, HCT, PLT in the last 168 hours. ------------------------------------------------------------------------------------------------------------------  Chemistries  No results for input(s): NA, K, CL, CO2, GLUCOSE, BUN, CREATININE, CALCIUM, MG, AST, ALT, ALKPHOS, BILITOT in the last 168 hours.  Invalid input(s): GFRCGP ------------------------------------------------------------------------------------------------------------------  Cardiac Enzymes No results for input(s): TROPONINI in the last 168 hours. ------------------------------------------------------------  RADIOLOGY:  No results found.     Thank  you for the consultation and for allowing [facility] to assist in the care of your patient. Our recommendations are noted above.  Please contact us if we can be of further service.   Marda Stalker, M.D., F.C.C.P.  Board Certified in Internal Medicine, Pulmonary Medicine, [place], and Sleep Medicine.  [facility] Office Number: [phone]   09/08/2018

## 2018-09-09 ENCOUNTER — Ambulatory Visit (INDEPENDENT_AMBULATORY_CARE_PROVIDER_SITE_OTHER): Payer: Medicare Other | Admitting: Internal Medicine

## 2018-09-09 ENCOUNTER — Encounter: Payer: Self-pay | Admitting: Internal Medicine

## 2018-09-09 VITALS — BP 128/84 | HR 80 | Resp 16 | Ht 63.5 in | Wt 116.0 lb

## 2018-09-09 DIAGNOSIS — R590 Localized enlarged lymph nodes: Secondary | ICD-10-CM

## 2018-09-09 DIAGNOSIS — R911 Solitary pulmonary nodule: Secondary | ICD-10-CM | POA: Diagnosis not present

## 2018-09-09 DIAGNOSIS — J42 Unspecified chronic bronchitis: Secondary | ICD-10-CM | POA: Diagnosis not present

## 2018-09-09 DIAGNOSIS — R0609 Other forms of dyspnea: Secondary | ICD-10-CM

## 2018-09-09 NOTE — Patient Instructions (Signed)
Continue Anoro, will check CT chest in about 1 month and see you back then.

## 2018-10-13 ENCOUNTER — Ambulatory Visit
Admission: RE | Admit: 2018-10-13 | Discharge: 2018-10-13 | Disposition: A | Discharge status: Home / Self Care | Payer: Medicare Other | Source: Ambulatory Visit | Attending: Internal Medicine | Admitting: Internal Medicine

## 2018-10-13 DIAGNOSIS — R911 Solitary pulmonary nodule: Secondary | ICD-10-CM | POA: Diagnosis not present

## 2018-10-13 DIAGNOSIS — I712 Thoracic aortic aneurysm, without rupture: Secondary | ICD-10-CM | POA: Diagnosis not present

## 2018-10-13 DIAGNOSIS — R918 Other nonspecific abnormal finding of lung field: Secondary | ICD-10-CM | POA: Diagnosis not present

## 2018-10-13 LAB — POCT I-STAT CREATININE: Creatinine, Ser: 0.7 mg/dL (ref 0.44–1.00)

## 2018-10-13 MED ORDER — IOPAMIDOL (ISOVUE-300) INJECTION 61%
75.0000 mL | Freq: Once | INTRAVENOUS | Status: AC | PRN
  Administered 2018-10-13: 60 mL via INTRAVENOUS

## 2018-10-16 ENCOUNTER — Encounter: Payer: Self-pay | Admitting: Internal Medicine

## 2018-10-16 ENCOUNTER — Ambulatory Visit (INDEPENDENT_AMBULATORY_CARE_PROVIDER_SITE_OTHER): Payer: Medicare Other | Admitting: Internal Medicine

## 2018-10-16 DIAGNOSIS — R918 Other nonspecific abnormal finding of lung field: Secondary | ICD-10-CM

## 2018-10-16 NOTE — Patient Instructions (Signed)
Continue Anoro, will repeat ct chest in 1 year, follow up after that.

## 2018-10-16 NOTE — Progress Notes (Signed)
[facility] Consultation      Assessment and Plan:  Chronic bronchitis with Dyspnea on exertion.  - She is doing better with Anoro inhaler with normal PFT, consistent with chronic bronchitis.  Hyperinflation consistent with emphysema is noted on imaging, however this is not seen on lung function testing. - Continue Anoro daily. --PCV13 12/01/13; PPSV 23 12/09/15. Completed flu.   Lung nodule. - Review of CT chest shows mild biapical scarring, minimally progressed over the last few years.  Multiple stable nodules, one new nodule not seen on last scan.  - Will repeat ct chest in 1 year.    Orders Placed This Encounter  Procedures  . CT CHEST WO CONTRAST   No follow-ups on file.    Date: 10/16/2018  MRN# [id number] Kaitlyn Bowen 05-27-1933    Kaitlyn Bowen is a 84 y.o. old female seen in consultation for chief complaint of:    Chief Complaint  Patient presents with  . COPD    breathing going well, anoro helping  . Cough    non productive    HPI:  She has been having dyspnea for several years, and it has been progressive. She gets winded with mild activity.  She continues to have occasional cough and mild dyspnea she is using Anoro once daily, and feels that it is helping, she is breathing better with it.   **CT chest 10/13/18>>imaging personally reviewed in comparison with previous in 2015; there is slight apical scarring that has progressed minimally since 2015. There are several small unchanged nodules, and a new posterior RLL nodule likely scar.  **PFT 08/21/2018>> tracings were personally reviewed.  FVC is 98% predicted, FEV1 is 108% predicted, ratio is 83%.  There is no significant improvement by the dilator therapy.  Flow volume loop is unremarkable. TLC is 87% reviewed, RV to TLC ratio is normal.  DLCO is 86%. -Overall this test shows normal pulmonary functions without evidence of obstructive lung disease. **Desat walk 08/07/18>>Desat walk at  rest on RA sat was 98% and HR 78. Pt walked 360 feet at brisk pace and conversational. Minimal dyspnea. End walk sat 98% and HR 89.  **Chest x-ray 07/29/2018>> imaging personally reviewed, chest x-ray shows hyperinflation consistent with emphysema.  There is mild right middle lobe scarring/infiltrate, right sided hilar adenopathy. **CT chest 08/20/2014>> normal lungs, some mild posterior bilateral pleural-based tiny nodules versus pleural thickening. **CBC 11/15/17>>Abs eos 100.   Medication:    Current Outpatient Medications:  .  amLODipine (NORVASC) 5 MG tablet, TAKE 1 TABLET BY MOUTH EVERY DAY, Disp: 90 tablet, Rfl: 1 .  aspirin 81 MG tablet, Take 81 mg by mouth daily., Disp: , Rfl:  .  Calcium Carbonate-Vit D-Min 600-400 MG-UNIT TABS, Take by mouth 2 (two) times daily. Take 1 tablet by mouth twice a day, Disp: , Rfl:  .  carbidopa-levodopa (SINEMET IR) 10-100 MG tablet, TAKE 1 TABLET BY MOUTH AT BEDTIME, Disp: 90 tablet, Rfl: 3 .  Cholecalciferol (VITAMIN D3) 2000 UNITS capsule, Take 2,000 Units by mouth daily., Disp: , Rfl:  .  levothyroxine (SYNTHROID, LEVOTHROID) 75 MCG tablet, TAKE 1 TABLET BY MOUTH EVERY DAY, Disp: 90 tablet, Rfl: 3 .  losartan (COZAAR) 100 MG tablet, TAKE 1 TABLET BY MOUTH EVERY DAY, Disp: 90 tablet, Rfl: 3 .  pantoprazole (PROTONIX) 40 MG tablet, TAKE 1 TABLET BY MOUTH EVERY DAY, Disp: 90 tablet, Rfl: 1 .  pravastatin (PRAVACHOL) 20 MG tablet, Take 20 mg by mouth daily., Disp: , Rfl:  .  umeclidinium-vilanterol (ANORO ELLIPTA) 62.5-25 MCG/INH AEPB, Inhale 1 puff into the lungs daily., Disp: 12 each, Rfl: 0 .  Vitamin D, Ergocalciferol, (DRISDOL) 50000 units CAPS capsule, TAKE 1 CAPSULE (50,000 UNITS TOTAL) BY MOUTH ONCE A WEEK., Disp: 4 capsule, Rfl: 2   Allergies:  Patient has no known allergies.  Review of Systems:  Constitutional: Feels well. Cardiovascular: Denies chest pain, exertional chest pain.  Pulmonary: Denies hemoptysis, pleuritic chest pain.   The  remainder of systems were reviewed and were found to be negative other than what is documented in the HPI.    Physical Examination:   VS: BP 108/78 (BP Location: Left Arm, Cuff Size: Normal)   Pulse (!) 113   Ht 5' 3.5" (1.613 m)   Wt 117 lb (53.1 kg)   LMP 10/23/1967   SpO2 96%   BMI 20.40 kg/m   General Appearance: No distress  Neuro:without focal findings, mental status, speech normal, alert and oriented HEENT: PERRLA, EOM intact Pulmonary: No wheezing, No rales  CardiovascularNormal S1,S2.  No m/r/g.  Abdomen: Benign, Soft, non-tender, No masses Renal:  No costovertebral tenderness  GU:  No performed at this time. Endoc: No evident thyromegaly, no signs of acromegaly or Cushing features Skin:   warm, no rashes, no ecchymosis  Extremities: normal, no cyanosis, clubbing.     LABORATORY PANEL:   CBC No results for input(s): WBC, HGB, HCT, PLT in the last 168 hours. ------------------------------------------------------------------------------------------------------------------  Chemistries  Recent Labs  Lab 10/13/18 0948  CREATININE 0.70   ------------------------------------------------------------------------------------------------------------------  Cardiac Enzymes No results for input(s): TROPONINI in the last 168 hours. ------------------------------------------------------------  RADIOLOGY:  No results found.     Thank  you for the consultation and for allowing [facility] to assist in the care of your patient. Our recommendations are noted above.  Please contact us if we can be of further service.   Marda Stalker, M.D., F.C.C.P.  Board Certified in Internal Medicine, Pulmonary Medicine, [place], and Sleep Medicine.  [facility] Office Number: [phone]   10/16/2018

## 2018-10-22 ENCOUNTER — Encounter: Payer: Self-pay | Admitting: Internal Medicine

## 2018-10-22 DIAGNOSIS — R911 Solitary pulmonary nodule: Secondary | ICD-10-CM | POA: Insufficient documentation

## 2018-10-24 ENCOUNTER — Ambulatory Visit: Payer: Medicare Other

## 2018-10-28 ENCOUNTER — Ambulatory Visit: Payer: Medicare Other

## 2018-11-28 DIAGNOSIS — E782 Mixed hyperlipidemia: Secondary | ICD-10-CM | POA: Diagnosis not present

## 2018-11-28 DIAGNOSIS — I6523 Occlusion and stenosis of bilateral carotid arteries: Secondary | ICD-10-CM | POA: Diagnosis not present

## 2018-11-28 DIAGNOSIS — I10 Essential (primary) hypertension: Secondary | ICD-10-CM | POA: Diagnosis not present

## 2018-11-28 DIAGNOSIS — Y92009 Unspecified place in unspecified non-institutional (private) residence as the place of occurrence of the external cause: Secondary | ICD-10-CM | POA: Diagnosis not present

## 2018-11-28 DIAGNOSIS — W19XXXS Unspecified fall, sequela: Secondary | ICD-10-CM | POA: Diagnosis not present

## 2018-12-02 ENCOUNTER — Other Ambulatory Visit (INDEPENDENT_AMBULATORY_CARE_PROVIDER_SITE_OTHER): Payer: Medicare Other

## 2018-12-02 DIAGNOSIS — I10 Essential (primary) hypertension: Secondary | ICD-10-CM

## 2018-12-02 DIAGNOSIS — E559 Vitamin D deficiency, unspecified: Secondary | ICD-10-CM | POA: Diagnosis not present

## 2018-12-02 DIAGNOSIS — E039 Hypothyroidism, unspecified: Secondary | ICD-10-CM | POA: Diagnosis not present

## 2018-12-02 DIAGNOSIS — E7800 Pure hypercholesterolemia, unspecified: Secondary | ICD-10-CM

## 2018-12-02 LAB — HEPATIC FUNCTION PANEL
ALT: 6 U/L (ref 0–35)
AST: 18 U/L (ref 0–37)
Albumin: 4.3 g/dL (ref 3.5–5.2)
Alkaline Phosphatase: 95 U/L (ref 39–117)
Bilirubin, Direct: 0.1 mg/dL (ref 0.0–0.3)
Total Bilirubin: 0.7 mg/dL (ref 0.2–1.2)
Total Protein: 7.6 g/dL (ref 6.0–8.3)

## 2018-12-02 LAB — LIPID PANEL
Cholesterol: 254 mg/dL — ABNORMAL HIGH (ref 0–200)
HDL: 90.5 mg/dL (ref >39.00)
LDL Cholesterol: 148 mg/dL — ABNORMAL HIGH (ref 0–99)
NONHDL: 163.61
Total CHOL/HDL Ratio: 3
Triglycerides: 80 mg/dL (ref 0.0–149.0)
VLDL: 16 mg/dL (ref 0.0–40.0)

## 2018-12-02 LAB — CBC WITH DIFFERENTIAL/PLATELET
Basophils Absolute: 0 10*3/uL (ref 0.0–0.1)
Basophils Relative: 0.9 % (ref 0.0–3.0)
Eosinophils Absolute: 0.1 10*3/uL (ref 0.0–0.7)
Eosinophils Relative: 1.8 % (ref 0.0–5.0)
HCT: 41.3 % (ref 36.0–46.0)
Hemoglobin: 13.9 g/dL (ref 12.0–15.0)
Lymphocytes Relative: 37.1 % (ref 12.0–46.0)
Lymphs Abs: 1.9 10*3/uL (ref 0.7–4.0)
MCHC: 33.5 g/dL (ref 30.0–36.0)
MCV: 88.4 fl (ref 78.0–100.0)
Monocytes Absolute: 0.5 10*3/uL (ref 0.1–1.0)
Monocytes Relative: 9.2 % (ref 3.0–12.0)
Neutro Abs: 2.6 10*3/uL (ref 1.4–7.7)
Neutrophils Relative %: 51 % (ref 43.0–77.0)
Platelets: 246 10*3/uL (ref 150.0–400.0)
RBC: 4.67 Mil/uL (ref 3.87–5.11)
RDW: 13.4 % (ref 11.5–15.5)
WBC: 5.2 10*3/uL (ref 4.0–10.5)

## 2018-12-02 LAB — BASIC METABOLIC PANEL
BUN: 16 mg/dL (ref 6–23)
CO2: 31 mEq/L (ref 19–32)
CREATININE: 0.77 mg/dL (ref 0.40–1.20)
Calcium: 9.6 mg/dL (ref 8.4–10.5)
Chloride: 103 mEq/L (ref 96–112)
GFR: 71.45 mL/min (ref >60.00)
Glucose, Bld: 100 mg/dL — ABNORMAL HIGH (ref 70–99)
Potassium: 3.9 mEq/L (ref 3.5–5.1)
Sodium: 142 mEq/L (ref 135–145)

## 2018-12-02 LAB — VITAMIN D 25 HYDROXY (VIT D DEFICIENCY, FRACTURES): VITD: 35.27 ng/mL (ref 30.00–100.00)

## 2018-12-02 LAB — TSH: TSH: 2.01 u[IU]/mL (ref 0.35–4.50)

## 2018-12-04 ENCOUNTER — Encounter: Payer: Self-pay | Admitting: Internal Medicine

## 2018-12-04 ENCOUNTER — Ambulatory Visit (INDEPENDENT_AMBULATORY_CARE_PROVIDER_SITE_OTHER): Payer: Medicare Other | Admitting: Internal Medicine

## 2018-12-04 VITALS — BP 142/84 | HR 78 | Temp 97.9°F | Resp 16 | Ht 64.0 in | Wt 116.0 lb

## 2018-12-04 DIAGNOSIS — M8000XD Age-related osteoporosis with current pathological fracture, unspecified site, subsequent encounter for fracture with routine healing: Secondary | ICD-10-CM

## 2018-12-04 DIAGNOSIS — R911 Solitary pulmonary nodule: Secondary | ICD-10-CM | POA: Diagnosis not present

## 2018-12-04 DIAGNOSIS — Z1239 Encounter for other screening for malignant neoplasm of breast: Secondary | ICD-10-CM

## 2018-12-04 DIAGNOSIS — R0602 Shortness of breath: Secondary | ICD-10-CM

## 2018-12-04 DIAGNOSIS — I10 Essential (primary) hypertension: Secondary | ICD-10-CM | POA: Diagnosis not present

## 2018-12-04 DIAGNOSIS — E039 Hypothyroidism, unspecified: Secondary | ICD-10-CM | POA: Diagnosis not present

## 2018-12-04 DIAGNOSIS — E559 Vitamin D deficiency, unspecified: Secondary | ICD-10-CM | POA: Diagnosis not present

## 2018-12-04 DIAGNOSIS — Z1231 Encounter for screening mammogram for malignant neoplasm of breast: Secondary | ICD-10-CM | POA: Diagnosis not present

## 2018-12-04 DIAGNOSIS — E7800 Pure hypercholesterolemia, unspecified: Secondary | ICD-10-CM

## 2018-12-04 DIAGNOSIS — K219 Gastro-esophageal reflux disease without esophagitis: Secondary | ICD-10-CM

## 2018-12-04 DIAGNOSIS — Z0000 Encounter for general adult medical examination without abnormal findings: Secondary | ICD-10-CM

## 2018-12-04 MED ORDER — AMLODIPINE BESYLATE 5 MG PO TABS: 5.0000 mg | ORAL_TABLET | Freq: Two times a day (BID) | ORAL | 1 refills | Status: DC
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
## 2018-12-04 NOTE — Patient Instructions (Signed)
Increase amlodipine to 5mg twice a day.  

## 2018-12-04 NOTE — Progress Notes (Signed)
[name], female   DOB: 29-Sep-1933, 85 y.o.   MRN: [id number]

## 2018-12-07 ENCOUNTER — Encounter: Payer: Self-pay | Admitting: Internal Medicine

## 2018-12-07 NOTE — Assessment & Plan Note (Signed)
On pravastatin.  Low cholesterol diet and exercise.  Follow lipid panel and liver function tests.   

## 2018-12-07 NOTE — Assessment & Plan Note (Signed)
Saw cardiology and pulmonary.  On anoro.  Breathing stable.

## 2018-12-07 NOTE — Assessment & Plan Note (Signed)
Reclast.  

## 2018-12-07 NOTE — Assessment & Plan Note (Signed)
Controlled on current regimen.   

## 2018-12-07 NOTE — Assessment & Plan Note (Signed)
On thyroid replacement.  Follow tsh.  

## 2018-12-07 NOTE — Assessment & Plan Note (Signed)
Saw pulmonary 10/2018.  Recommended f/u CT chest in one year.

## 2018-12-07 NOTE — Assessment & Plan Note (Signed)
Blood pressure remains elevated.  Increase amlodipine to 5mg  bid.  Follow pressures.  Follow metabolic panel.

## 2018-12-07 NOTE — Assessment & Plan Note (Signed)
Follow vitamin D level.  

## 2018-12-07 NOTE — Assessment & Plan Note (Signed)
Physical today 12/04/18.  Mammogram 09/19/17 - Birads I.  Overdue mammogram.  Need to schedule.  Colonoscopy 2011.

## 2018-12-07 NOTE — Addendum Note (Signed)
Addended by: Alisa Graff on: 12/07/2018 06:04 PM   Modules accepted: Orders

## 2018-12-24 ENCOUNTER — Ambulatory Visit
Admission: RE | Admit: 2018-12-24 | Discharge: 2018-12-24 | Disposition: A | Discharge status: Home / Self Care | Payer: Medicare Other | Source: Ambulatory Visit | Attending: Internal Medicine | Admitting: Internal Medicine

## 2018-12-24 DIAGNOSIS — Z1231 Encounter for screening mammogram for malignant neoplasm of breast: Secondary | ICD-10-CM | POA: Diagnosis not present

## 2019-02-03 ENCOUNTER — Ambulatory Visit: Payer: Medicare Other | Admitting: Internal Medicine

## 2019-04-29 ENCOUNTER — Other Ambulatory Visit: Payer: Self-pay | Admitting: Internal Medicine

## 2019-06-08 ENCOUNTER — Other Ambulatory Visit: Payer: Self-pay | Admitting: Internal Medicine

## 2019-07-27 ENCOUNTER — Other Ambulatory Visit: Payer: Self-pay | Admitting: Internal Medicine

## 2019-07-28 ENCOUNTER — Ambulatory Visit (INDEPENDENT_AMBULATORY_CARE_PROVIDER_SITE_OTHER): Payer: Medicare Other

## 2019-07-28 ENCOUNTER — Other Ambulatory Visit: Payer: Self-pay

## 2019-07-28 DIAGNOSIS — Z0000 Encounter for general adult medical examination without abnormal findings: Secondary | ICD-10-CM
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
## 2019-07-28 NOTE — Patient Instructions (Addendum)
  Ms. Lathrop , Thank you for taking time to come for your Medicare Wellness Visit. I appreciate your ongoing commitment to your health goals. Please review the following plan we discussed and let me know if I can assist you in the future.   These are the goals we discussed: Goals      Patient Stated   . Portion control meals/snack (pt-stated)     Portion control sweet intake Monitor cholesterol       This is a list of the screening recommended for you and due dates:  Health Maintenance  Topic Date Due  . Tetanus Vaccine  01/30/1954  . Flu Shot  02/10/2020*  . Mammogram  12/25/2019  . DEXA scan (bone density measurement)  Completed  . Pneumonia vaccines  Completed  *Topic was postponed. The date shown is not the original due date.

## 2019-07-31 ENCOUNTER — Encounter: Payer: Self-pay | Admitting: Internal Medicine

## 2019-07-31 ENCOUNTER — Ambulatory Visit (INDEPENDENT_AMBULATORY_CARE_PROVIDER_SITE_OTHER): Payer: Medicare Other | Admitting: Internal Medicine

## 2019-07-31 ENCOUNTER — Other Ambulatory Visit: Payer: Self-pay

## 2019-07-31 ENCOUNTER — Ambulatory Visit: Payer: Medicare Other

## 2019-07-31 VITALS — BP 130/74 | HR 76 | Temp 97.0°F | Resp 16 | Wt 118.0 lb

## 2019-07-31 DIAGNOSIS — E559 Vitamin D deficiency, unspecified: Secondary | ICD-10-CM | POA: Diagnosis not present

## 2019-07-31 DIAGNOSIS — F439 Reaction to severe stress, unspecified: Secondary | ICD-10-CM | POA: Diagnosis not present

## 2019-07-31 DIAGNOSIS — I10 Essential (primary) hypertension: Secondary | ICD-10-CM | POA: Diagnosis not present

## 2019-07-31 DIAGNOSIS — Z23 Encounter for immunization: Secondary | ICD-10-CM

## 2019-07-31 DIAGNOSIS — R911 Solitary pulmonary nodule: Secondary | ICD-10-CM | POA: Diagnosis not present

## 2019-07-31 DIAGNOSIS — J42 Unspecified chronic bronchitis: Secondary | ICD-10-CM | POA: Diagnosis not present

## 2019-07-31 DIAGNOSIS — E039 Hypothyroidism, unspecified: Secondary | ICD-10-CM | POA: Diagnosis not present

## 2019-07-31 DIAGNOSIS — E7800 Pure hypercholesterolemia, unspecified: Secondary | ICD-10-CM

## 2019-07-31 DIAGNOSIS — M25473 Effusion, unspecified ankle: Secondary | ICD-10-CM

## 2019-07-31 DIAGNOSIS — K219 Gastro-esophageal reflux disease without esophagitis: Secondary | ICD-10-CM

## 2019-07-31 LAB — HEPATIC FUNCTION PANEL
ALT: 11 U/L (ref 0–35)
AST: 18 U/L (ref 0–37)
Albumin: 4.3 g/dL (ref 3.5–5.2)
Alkaline Phosphatase: 108 U/L (ref 39–117)
Bilirubin, Direct: 0 mg/dL (ref 0.0–0.3)
Total Bilirubin: 0.7 mg/dL (ref 0.2–1.2)
Total Protein: 7.4 g/dL (ref 6.0–8.3)

## 2019-07-31 LAB — BASIC METABOLIC PANEL
BUN: 17 mg/dL (ref 6–23)
CO2: 28 mEq/L (ref 19–32)
Calcium: 9.3 mg/dL (ref 8.4–10.5)
Chloride: 105 mEq/L (ref 96–112)
Creatinine, Ser: 0.75 mg/dL (ref 0.40–1.20)
GFR: 73.53 mL/min (ref >60.00)
Glucose, Bld: 95 mg/dL (ref 70–99)
Potassium: 3.6 mEq/L (ref 3.5–5.1)
Sodium: 141 mEq/L (ref 135–145)

## 2019-07-31 LAB — TSH: TSH: 0.59 u[IU]/mL (ref 0.35–4.50)

## 2019-07-31 LAB — LIPID PANEL
Cholesterol: 218 mg/dL — ABNORMAL HIGH (ref 0–200)
HDL: 88.8 mg/dL (ref >39.00)
LDL Cholesterol: 118 mg/dL — ABNORMAL HIGH (ref 0–99)
NonHDL: 129.42
Total CHOL/HDL Ratio: 2
Triglycerides: 57 mg/dL (ref 0.0–149.0)
VLDL: 11.4 mg/dL (ref 0.0–40.0)
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
## 2019-08-03 ENCOUNTER — Encounter: Payer: Self-pay | Admitting: Internal Medicine

## 2019-08-03 ENCOUNTER — Encounter: Payer: Self-pay | Admitting: *Deleted

## 2019-08-03 DIAGNOSIS — M25473 Effusion, unspecified ankle: Secondary | ICD-10-CM | POA: Insufficient documentation

## 2019-08-03 DIAGNOSIS — J42 Unspecified chronic bronchitis: Secondary | ICD-10-CM | POA: Insufficient documentation

## 2019-08-03 NOTE — Assessment & Plan Note (Signed)
Saw pulmonary 10/2018.  Recommended f/u CT chest - one year.

## 2019-08-03 NOTE — Assessment & Plan Note (Signed)
Controlled on current regimen.  Follow.  

## 2019-08-03 NOTE — Assessment & Plan Note (Signed)
No significant swelling on exam today.  She reports better in am, worse as day progresses.  Compression hose.  Leg elevation.  Follow.

## 2019-08-03 NOTE — Assessment & Plan Note (Signed)
Follow vitamin D level.  

## 2019-08-03 NOTE — Assessment & Plan Note (Signed)
On thyroid replacement.  Follow tsh.  

## 2019-08-03 NOTE — Assessment & Plan Note (Signed)
Doing well.  Follow.  

## 2019-08-03 NOTE — Assessment & Plan Note (Signed)
Blood pressure under good control.  Continue same medication regimen.  Follow pressures.  Follow metabolic panel.   

## 2019-08-03 NOTE — Assessment & Plan Note (Signed)
Has seen pulmonary.  Breathing doing well.  No increased cough or congestion.  Follow.

## 2019-08-03 NOTE — Assessment & Plan Note (Signed)
On pravastatin.  Follow lipid panel and liver function tests.   

## 2019-08-31 DIAGNOSIS — I10 Essential (primary) hypertension: Secondary | ICD-10-CM | POA: Diagnosis not present

## 2019-08-31 DIAGNOSIS — E782 Mixed hyperlipidemia: Secondary | ICD-10-CM | POA: Diagnosis not present

## 2019-08-31 DIAGNOSIS — I471 Supraventricular tachycardia: Secondary | ICD-10-CM | POA: Diagnosis not present

## 2019-10-06 ENCOUNTER — Other Ambulatory Visit: Payer: Self-pay

## 2019-10-06 DIAGNOSIS — R918 Other nonspecific abnormal finding of lung field: Secondary | ICD-10-CM

## 2019-11-09 ENCOUNTER — Ambulatory Visit
Admission: RE | Admit: 2019-11-09 | Discharge: 2019-11-09 | Disposition: A | Discharge status: Home / Self Care | Payer: Medicare Other | Source: Ambulatory Visit | Attending: Internal Medicine | Admitting: Internal Medicine

## 2019-11-09 ENCOUNTER — Other Ambulatory Visit: Payer: Self-pay

## 2019-11-09 DIAGNOSIS — R918 Other nonspecific abnormal finding of lung field: Secondary | ICD-10-CM | POA: Diagnosis present

## 2019-11-19 ENCOUNTER — Telehealth: Payer: Self-pay | Admitting: Internal Medicine

## 2019-11-19 NOTE — Telephone Encounter (Signed)
Called and spoke to pt, who is requesting CT results from 11/09/2019.  DK, please advise. Thanks

## 2019-11-24 NOTE — Telephone Encounter (Signed)
I will update next week when I am in office

## 2019-11-24 NOTE — Telephone Encounter (Signed)
DK, please advise on results. thanks

## 2019-12-02 NOTE — Telephone Encounter (Signed)
Dr. Kasa please advise. Thanks 

## 2019-12-02 NOTE — Telephone Encounter (Signed)
THERE ARE NO SIGNIFICANT CHANGES FROM PREVIOUS SCAN FROM 1 year AGO. NO NEW FINDINGS, FOLLOW UP AS SCHEDULED

## 2019-12-02 NOTE — Telephone Encounter (Signed)
Lm for pt

## 2019-12-03 NOTE — Telephone Encounter (Signed)
Pt is aware of results and voiced her understanding.  Pt has pending OV for 12/22/2019. Nothing further is needed.

## 2019-12-05 ENCOUNTER — Other Ambulatory Visit: Payer: Self-pay | Admitting: Internal Medicine

## 2019-12-07 ENCOUNTER — Encounter: Payer: Self-pay | Admitting: Internal Medicine

## 2019-12-07 ENCOUNTER — Other Ambulatory Visit: Payer: Self-pay

## 2019-12-07 ENCOUNTER — Ambulatory Visit (INDEPENDENT_AMBULATORY_CARE_PROVIDER_SITE_OTHER): Payer: Medicare Other | Admitting: Internal Medicine

## 2019-12-07 VITALS — BP 124/78 | HR 72 | Ht 64.0 in | Wt 112.0 lb

## 2019-12-07 DIAGNOSIS — E7800 Pure hypercholesterolemia, unspecified: Secondary | ICD-10-CM | POA: Diagnosis not present

## 2019-12-07 DIAGNOSIS — I10 Essential (primary) hypertension: Secondary | ICD-10-CM | POA: Diagnosis not present

## 2019-12-07 DIAGNOSIS — E039 Hypothyroidism, unspecified: Secondary | ICD-10-CM

## 2019-12-07 DIAGNOSIS — J42 Unspecified chronic bronchitis: Secondary | ICD-10-CM

## 2019-12-07 DIAGNOSIS — Z1231 Encounter for screening mammogram for malignant neoplasm of breast: Secondary | ICD-10-CM

## 2019-12-07 DIAGNOSIS — K219 Gastro-esophageal reflux disease without esophagitis: Secondary | ICD-10-CM

## 2019-12-07 DIAGNOSIS — R911 Solitary pulmonary nodule: Secondary | ICD-10-CM
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
## 2019-12-09 ENCOUNTER — Encounter: Payer: Self-pay | Admitting: Internal Medicine

## 2019-12-12 ENCOUNTER — Encounter: Payer: Self-pay | Admitting: Internal Medicine

## 2019-12-12 NOTE — Assessment & Plan Note (Signed)
Saw pulmonary 10/2018.  Recommended f/u CT chest in one year.  Reviewed phone call - Dr Mortimer Fries.  Stable.  Has pending appt with pulmonary 12/2019.

## 2019-12-12 NOTE — Assessment & Plan Note (Signed)
On pravastatin.  Follow lipid panel and liver function tests.   

## 2019-12-12 NOTE — Assessment & Plan Note (Signed)
Controlled on current regimen.   

## 2019-12-12 NOTE — Assessment & Plan Note (Signed)
Blood pressure under good control.  Continue same medication regimen.  Follow pressures.  Follow metabolic panel.   

## 2019-12-12 NOTE — Assessment & Plan Note (Signed)
Has been evaluated and is followed by pulmonary.  Breathing stable.  Was placed on anoro.  Continue.  Follow.

## 2019-12-12 NOTE — Assessment & Plan Note (Signed)
On thyroid replacement.  Follow tsh.  

## 2019-12-16 ENCOUNTER — Encounter: Payer: Self-pay | Admitting: Internal Medicine

## 2019-12-22 ENCOUNTER — Ambulatory Visit (INDEPENDENT_AMBULATORY_CARE_PROVIDER_SITE_OTHER): Payer: Medicare Other | Admitting: Internal Medicine

## 2019-12-22 ENCOUNTER — Encounter: Payer: Self-pay | Admitting: Internal Medicine

## 2019-12-22 DIAGNOSIS — R9389 Abnormal findings on diagnostic imaging of other specified body structures: Secondary | ICD-10-CM

## 2019-12-22 DIAGNOSIS — J4520 Mild intermittent asthma, uncomplicated: Secondary | ICD-10-CM | POA: Diagnosis not present

## 2019-12-22 NOTE — Patient Instructions (Signed)
Use inhalers as needed No further CT scans at this time  Consider vascular surgery follow-up for aortic aneurysm

## 2019-12-22 NOTE — Progress Notes (Signed)
[facility] Consultation    **CT chest 10/13/18>>imaging personally reviewed in comparison with previous in 2015; there is slight apical scarring that has progressed minimally since 2015. There are several small unchanged nodules, and a new posterior RLL nodule likely scar.  **PFT 08/21/2018>> tracings were personally reviewed.  FVC is 98% predicted, FEV1 is 108% predicted, ratio is 83%.  There is no significant improvement by the dilator therapy.  Flow volume loop is unremarkable. TLC is 87% reviewed, RV to TLC ratio is normal.  DLCO is 86%. -Overall this test shows normal pulmonary functions without evidence of obstructive lung disease. **Desat walk 08/07/18>>Desat walk at rest on RA sat was 98% and HR 78. Pt walked 360 feet at brisk pace and conversational. Minimal dyspnea. End walk sat 98% and HR 89.  **Chest x-ray 07/29/2018>> imaging personally reviewed, chest x-ray shows hyperinflation consistent with emphysema.  There is mild right middle lobe scarring/infiltrate, right sided hilar adenopathy. **CT chest 08/20/2014>> normal lungs, some mild posterior bilateral pleural-based tiny nodules versus pleural thickening. **CBC 11/15/17>>Abs eos 100.     Date: 12/22/2019  MRN# [id number] Kaitlyn Bowen 02-13-1933  CC Follow up SOB Follow-up abnormal CT chest  HPI: Patient has chronic shortness of breath for many years Slowly progressive over the last several months, but she states that she feels good now Patient without any obvious distress  No evidence of heart failure at this time No evidence or signs of infection at this time No respiratory distress No fevers, chills, nausea, vomiting, diarrhea No evidence of lower extremity edema No evidence hemoptysis    Medication:    Current Outpatient Medications:  .  amLODipine (NORVASC) 5 MG tablet, TAKE 1 TABLET BY MOUTH TWICE A DAY, Disp: 180 tablet, Rfl: 1 .  aspirin 81 MG tablet, Take 81 mg by mouth daily., Disp: ,  Rfl:  .  Calcium Carbonate-Vit D-Min 600-400 MG-UNIT TABS, Take by mouth 2 (two) times daily. Take 1 tablet by mouth twice a day, Disp: , Rfl:  .  carbidopa-levodopa (SINEMET IR) 10-100 MG tablet, TAKE 1 TABLET BY MOUTH AT BEDTIME, Disp: 90 tablet, Rfl: 3 .  Cholecalciferol (VITAMIN D3) 2000 UNITS capsule, Take 2,000 Units by mouth daily., Disp: , Rfl:  .  levothyroxine (SYNTHROID) 75 MCG tablet, TAKE 1 TABLET BY MOUTH EVERY DAY, Disp: 90 tablet, Rfl: 3 .  losartan (COZAAR) 100 MG tablet, TAKE 1 TABLET BY MOUTH EVERY DAY, Disp: 90 tablet, Rfl: 3 .  pantoprazole (PROTONIX) 40 MG tablet, TAKE 1 TABLET BY MOUTH EVERY DAY, Disp: 90 tablet, Rfl: 1 .  pravastatin (PRAVACHOL) 20 MG tablet, Take 20 mg by mouth daily., Disp: , Rfl:  .  umeclidinium-vilanterol (ANORO ELLIPTA) 62.5-25 MCG/INH AEPB, Inhale 1 puff into the lungs daily., Disp: 12 each, Rfl: 0   Allergies:  Patient has no known allergies.  Review of Systems:  Gen:  Denies  fever, sweats, chills weight loss  HEENT: Denies blurred vision, double vision, ear pain, eye pain, hearing loss, nose bleeds, sore throat Cardiac:  No dizziness, chest pain or heaviness, chest tightness,edema, No JVD Resp:   No cough, -sputum production, -shortness of breath,-wheezing, -hemoptysis,  Gi: Denies swallowing difficulty, stomach pain, nausea or vomiting, diarrhea, constipation, bowel incontinence Gu:  Denies bladder incontinence, burning urine Ext:   Denies Joint pain, stiffness or swelling Skin: Denies  skin rash, easy bruising or bleeding or hives Endoc:  Denies polyuria, polydipsia , polyphagia or weight change Psych:   Denies depression, insomnia or  hallucinations  Other:  All other systems negative   CT CHEST-10/2019-CT chest independently reviewed by me today CT chest was reviewed in detail with the patient 1. Oblong area of nodularity in the left upper lobe near the apex referenced on the previous exam is unchanged present as a  subtle abnormality in 2015 and adjacent areas of pleural and parenchymal scarring not changed since 10/13/2018 likely related to worsening findings of pleural and parenchymal scarring. 2. Stable bilateral pulmonary nodules, largest measuring 5 mm in the right lower lobe. No further follow-up is necessary for these Nodules. Stable ascending aortic 4.2 cm aneurysm    Assessment and Plan:  Chronic bronchitis with Dyspnea on exertion Uses ANORO as needed, last used 1 month SOB improved with time FLU SHOT up to date 07/2019 --PCV13 12/01/13; PPSV 23 12/09/15.   H/O Lung nodule Review of CT chest shows mild biapical scarring from December 2020 It has minimally progressed over the last few years Multiple subcentimeter stable nodules No new nodules seen At this time I do not see the need for continuing to obtain CT chest patient feels very comfortable not obtaining any more CT scans at this time  CT of the chest did show- Stable ascending thoracic aortic aneurysm measuring 4.2 cm in Diameter At this time patient refuses to be evaluated by vascular surgeon and is not bothered by it I Have instructed patient to watch for abdominal pain and symptoms and back pain    COVID-19 EDUCATION: The signs and symptoms of COVID-19 were discussed with the patient and how to seek care for testing.  The importance of social distancing was discussed today. Hand Washing Techniques and avoid touching face was advised.     MEDICATION ADJUSTMENTS/LABS AND TESTS ORDERED: Continue inhalers as needed No indication for CT scan at this time    [place]   Patient satisfied with Plan of action and management. All questions answered  Follow up in 1 year  Total time spent 21 minutes  Maretta Bees Patricia Pesa, M.D.  [facility]  Medical Director [place] Director [facility] Cardio-Pulmonary Department

## 2020-01-05 ENCOUNTER — Other Ambulatory Visit: Payer: Self-pay

## 2020-01-05 ENCOUNTER — Other Ambulatory Visit (INDEPENDENT_AMBULATORY_CARE_PROVIDER_SITE_OTHER): Payer: Medicare Other

## 2020-01-05 DIAGNOSIS — E7800 Pure hypercholesterolemia, unspecified: Secondary | ICD-10-CM

## 2020-01-05 DIAGNOSIS — I10 Essential (primary) hypertension: Secondary | ICD-10-CM

## 2020-01-05 DIAGNOSIS — E039 Hypothyroidism, unspecified: Secondary | ICD-10-CM | POA: Diagnosis not present

## 2020-01-05 LAB — CBC WITH DIFFERENTIAL/PLATELET
Basophils Absolute: 0 10*3/uL (ref 0.0–0.1)
Basophils Relative: 0.7 % (ref 0.0–3.0)
Eosinophils Absolute: 0.1 10*3/uL (ref 0.0–0.7)
Eosinophils Relative: 1.1 % (ref 0.0–5.0)
HCT: 38.9 % (ref 36.0–46.0)
Hemoglobin: 13 g/dL (ref 12.0–15.0)
Lymphocytes Relative: 34.8 % (ref 12.0–46.0)
Lymphs Abs: 1.8 10*3/uL (ref 0.7–4.0)
MCHC: 33.5 g/dL (ref 30.0–36.0)
MCV: 89 fl (ref 78.0–100.0)
Monocytes Absolute: 0.5 10*3/uL (ref 0.1–1.0)
Monocytes Relative: 9.1 % (ref 3.0–12.0)
Neutro Abs: 2.8 10*3/uL (ref 1.4–7.7)
Neutrophils Relative %: 54.3 % (ref 43.0–77.0)
Platelets: 273 10*3/uL (ref 150.0–400.0)
RBC: 4.37 Mil/uL (ref 3.87–5.11)
RDW: 13.4 % (ref 11.5–15.5)
WBC: 5.1 10*3/uL (ref 4.0–10.5)

## 2020-01-05 LAB — TSH: TSH: 5.09 u[IU]/mL — ABNORMAL HIGH (ref 0.35–4.50)

## 2020-01-05 LAB — LIPID PANEL
Cholesterol: 241 mg/dL — ABNORMAL HIGH (ref 0–200)
HDL: 86.8 mg/dL (ref >39.00)
LDL Cholesterol: 141 mg/dL — ABNORMAL HIGH (ref 0–99)
NonHDL: 154.57
Total CHOL/HDL Ratio: 3
Triglycerides: 69 mg/dL (ref 0.0–149.0)
VLDL: 13.8 mg/dL (ref 0.0–40.0)

## 2020-01-05 LAB — HEPATIC FUNCTION PANEL
ALT: 8 U/L (ref 0–35)
AST: 17 U/L (ref 0–37)
Albumin: 4.1 g/dL (ref 3.5–5.2)
Alkaline Phosphatase: 94 U/L (ref 39–117)
Bilirubin, Direct: 0.1 mg/dL (ref 0.0–0.3)
Total Bilirubin: 0.6 mg/dL (ref 0.2–1.2)
Total Protein: 7.1 g/dL (ref 6.0–8.3)

## 2020-01-05 LAB — BASIC METABOLIC PANEL
BUN: 22 mg/dL (ref 6–23)
CO2: 26 mEq/L (ref 19–32)
Calcium: 9.2 mg/dL (ref 8.4–10.5)
Chloride: 105 mEq/L (ref 96–112)
Creatinine, Ser: 0.78 mg/dL (ref 0.40–1.20)
GFR: 70.2 mL/min (ref >60.00)
Glucose, Bld: 97 mg/dL (ref 70–99)
Potassium: 3.7 mEq/L (ref 3.5–5.1)
Sodium: 139 mEq/L (ref 135–145)

## 2020-01-06 ENCOUNTER — Other Ambulatory Visit: Payer: Self-pay

## 2020-01-06 MED ORDER — PRAVASTATIN SODIUM 40 MG PO TABS: 40.0000 mg | ORAL_TABLET | Freq: Every day | ORAL | 1 refills | Status: DC

## 2020-01-06 MED ORDER — LEVOTHYROXINE SODIUM 88 MCG PO TABS: 75.0000 ug | ORAL_TABLET | Freq: Every day | ORAL | 1 refills | Status: DC

## 2020-02-16 ENCOUNTER — Telehealth: Payer: Self-pay | Admitting: *Deleted

## 2020-02-16 DIAGNOSIS — E039 Hypothyroidism, unspecified: Secondary | ICD-10-CM

## 2020-02-16 NOTE — Telephone Encounter (Signed)
Please place future orders for lab appt.  

## 2020-02-17 ENCOUNTER — Other Ambulatory Visit: Payer: Self-pay

## 2020-02-17 ENCOUNTER — Telehealth: Payer: Self-pay | Admitting: Internal Medicine

## 2020-02-17 ENCOUNTER — Other Ambulatory Visit (INDEPENDENT_AMBULATORY_CARE_PROVIDER_SITE_OTHER): Payer: Medicare Other

## 2020-02-17 DIAGNOSIS — E039 Hypothyroidism, unspecified: Secondary | ICD-10-CM | POA: Diagnosis not present

## 2020-02-17 LAB — TSH: TSH: 3.41 u[IU]/mL (ref 0.35–4.50)

## 2020-02-17 NOTE — Telephone Encounter (Signed)
Order placed for f/u tsh.  

## 2020-04-13 ENCOUNTER — Ambulatory Visit (INDEPENDENT_AMBULATORY_CARE_PROVIDER_SITE_OTHER): Payer: Medicare Other | Admitting: Internal Medicine

## 2020-04-13 ENCOUNTER — Encounter: Payer: Self-pay | Admitting: Internal Medicine

## 2020-04-13 ENCOUNTER — Other Ambulatory Visit: Payer: Self-pay

## 2020-04-13 DIAGNOSIS — E7800 Pure hypercholesterolemia, unspecified: Secondary | ICD-10-CM | POA: Diagnosis not present

## 2020-04-13 DIAGNOSIS — F439 Reaction to severe stress, unspecified: Secondary | ICD-10-CM

## 2020-04-13 DIAGNOSIS — E039 Hypothyroidism, unspecified: Secondary | ICD-10-CM | POA: Diagnosis not present

## 2020-04-13 DIAGNOSIS — K219 Gastro-esophageal reflux disease without esophagitis: Secondary | ICD-10-CM | POA: Diagnosis not present

## 2020-04-13 DIAGNOSIS — I10 Essential (primary) hypertension: Secondary | ICD-10-CM

## 2020-04-13 DIAGNOSIS — R911 Solitary pulmonary nodule: Secondary | ICD-10-CM
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
## 2020-04-17 ENCOUNTER — Encounter: Payer: Self-pay | Admitting: Internal Medicine

## 2020-04-17 NOTE — Assessment & Plan Note (Signed)
Controlled on protonix.  Follow.   

## 2020-04-17 NOTE — Assessment & Plan Note (Signed)
On pravastatin.  Low cholesterol diet and exercise.  Follow lipid panel and liver function tests.   

## 2020-04-17 NOTE — Assessment & Plan Note (Signed)
Doing well.  Follow.  

## 2020-04-17 NOTE — Assessment & Plan Note (Signed)
Had f/u with pulmonary 12/2019.  Recommended no further scans.  Stable. F/u with pulmonary recommended in one year.

## 2020-04-17 NOTE — Assessment & Plan Note (Signed)
Blood pressure doing well.  Outside checks averaging 130s/70s.  Continue current medication regimen - losartan and amlodipine.  Follow pressures.  Follow metabolic panel.

## 2020-04-17 NOTE — Assessment & Plan Note (Signed)
On thyroid replacement.  Follow tsh.  

## 2020-04-20 ENCOUNTER — Ambulatory Visit
Admission: RE | Admit: 2020-04-20 | Discharge: 2020-04-20 | Disposition: A | Discharge status: Home / Self Care | Payer: Medicare Other | Source: Ambulatory Visit | Attending: Internal Medicine | Admitting: Internal Medicine

## 2020-04-20 DIAGNOSIS — Z1231 Encounter for screening mammogram for malignant neoplasm of breast: Secondary | ICD-10-CM

## 2020-05-05 ENCOUNTER — Other Ambulatory Visit: Payer: Self-pay | Admitting: Internal Medicine

## 2020-05-11 ENCOUNTER — Other Ambulatory Visit: Payer: Self-pay

## 2020-05-11 ENCOUNTER — Other Ambulatory Visit (INDEPENDENT_AMBULATORY_CARE_PROVIDER_SITE_OTHER): Payer: Medicare Other

## 2020-05-11 DIAGNOSIS — E7800 Pure hypercholesterolemia, unspecified: Secondary | ICD-10-CM | POA: Diagnosis not present

## 2020-05-11 DIAGNOSIS — I10 Essential (primary) hypertension: Secondary | ICD-10-CM | POA: Diagnosis not present

## 2020-05-11 DIAGNOSIS — E039 Hypothyroidism, unspecified: Secondary | ICD-10-CM | POA: Diagnosis not present

## 2020-05-11 NOTE — Addendum Note (Signed)
Addended by: Tor Netters I on: 05/11/2020 08:43 AM   Modules accepted: Orders

## 2020-05-12 LAB — HEPATIC FUNCTION PANEL
AG Ratio: 1.6 (calc) (ref 1.0–2.5)
ALT: 8 U/L (ref 6–29)
AST: 15 U/L (ref 10–35)
Albumin: 4.2 g/dL (ref 3.6–5.1)
Alkaline phosphatase (APISO): 94 U/L (ref 37–153)
Bilirubin, Direct: 0.1 mg/dL (ref 0.0–0.2)
Globulin: 2.7 g/dL (calc) (ref 1.9–3.7)
Indirect Bilirubin: 0.5 mg/dL (calc) (ref 0.2–1.2)
Total Bilirubin: 0.6 mg/dL (ref 0.2–1.2)
Total Protein: 6.9 g/dL (ref 6.1–8.1)

## 2020-05-12 LAB — BASIC METABOLIC PANEL
BUN/Creatinine Ratio: 33 (calc) — ABNORMAL HIGH (ref 6–22)
BUN: 26 mg/dL — ABNORMAL HIGH (ref 7–25)
CO2: 26 mmol/L (ref 20–32)
Calcium: 9.3 mg/dL (ref 8.6–10.4)
Chloride: 106 mmol/L (ref 98–110)
Creat: 0.79 mg/dL (ref 0.60–0.88)
Glucose, Bld: 94 mg/dL (ref 65–99)
Potassium: 3.8 mmol/L (ref 3.5–5.3)
Sodium: 141 mmol/L (ref 135–146)

## 2020-05-12 LAB — LIPID PANEL
Cholesterol: 183 mg/dL (ref <200)
HDL: 85 mg/dL (ref >=50)
LDL Cholesterol (Calc): 84 mg/dL (calc)
Non-HDL Cholesterol (Calc): 98 mg/dL (calc) (ref <130)
Total CHOL/HDL Ratio: 2.2 (calc) (ref <5.0)
Triglycerides: 64 mg/dL (ref <150)

## 2020-05-12 LAB — TSH: TSH: 0.51 mIU/L (ref 0.40–4.50)

## 2020-07-04 ENCOUNTER — Other Ambulatory Visit: Payer: Self-pay | Admitting: Internal Medicine

## 2020-07-28 ENCOUNTER — Ambulatory Visit (INDEPENDENT_AMBULATORY_CARE_PROVIDER_SITE_OTHER): Payer: Medicare Other

## 2020-07-28 VITALS — Ht 64.0 in | Wt 117.0 lb

## 2020-07-28 DIAGNOSIS — Z0000 Encounter for general adult medical examination without abnormal findings: Secondary | ICD-10-CM
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
## 2020-07-28 NOTE — Patient Instructions (Addendum)
Kaitlyn Bowen , Thank you for taking time to come for your Medicare Wellness Visit. I appreciate your ongoing commitment to your health goals. Please review the following plan we discussed and let me know if I can assist you in the future.   These are the goals we discussed: Goals    . Healthy Lifestyle     Maintain a healthy diet, adequate water intake and using the treadmill for exercise.       This is a list of the screening recommended for you and due dates:  Health Maintenance  Topic Date Due  . COVID-19 Vaccine (1) 08/13/2020*  . Flu Shot  08/18/2020*  . Tetanus Vaccine  07/28/2021*  . Mammogram  04/20/2021  . DEXA scan (bone density measurement)  Completed  . Pneumonia vaccines  Completed  *Topic was postponed. The date shown is not the original due date.    Immunizations Immunization History  Administered Date(s) Administered  . Fluad Quad(high Dose 65+) 07/31/2019  . Influenza, High Dose Seasonal PF 07/30/2016, 08/08/2017, 07/29/2018  . Influenza, Seasonal, Injecte, Preservative Fre 10/22/2012  . Influenza,inj,Quad PF,6+ Mos 07/20/2013, 07/15/2014, 12/09/2015  . Influenza-Unspecified 07/20/2013, 07/15/2014, 12/09/2015, 07/30/2016  . Pneumococcal Conjugate-13 12/01/2013  . Pneumococcal Polysaccharide-23 12/09/2015   Keep all routine maintenance appointments.   Follow up 08/18/20 @ 10:00  Advanced directives: End of life planning; Advance aging; Advanced directives discussed.  Copy of current HCPOA/Living Will requested.    Conditions/risks identified: none new.  Follow up in one year for your annual wellness visit.   Preventive Care 82 Years and Older, Female Preventive care refers to lifestyle choices and visits with your health care provider that can promote health and wellness. What does preventive care include?  A yearly physical exam. This is also called an annual well check.  Dental exams once or twice a year.  Routine eye exams. Ask your health care  provider how often you should have your eyes checked.  Personal lifestyle choices, including:  Daily care of your teeth and gums.  Regular physical activity.  Eating a healthy diet.  Avoiding tobacco and drug use.  Limiting alcohol use.  Practicing safe sex.  Taking low-dose aspirin every day.  Taking vitamin and mineral supplements as recommended by your health care provider. What happens during an annual well check? The services and screenings done by your health care provider during your annual well check will depend on your age, overall health, lifestyle risk factors, and family history of disease. Counseling  Your health care provider may ask you questions about your:  Alcohol use.  Tobacco use.  Drug use.  Emotional well-being.  Home and relationship well-being.  Sexual activity.  Eating habits.  History of falls.  Memory and ability to understand (cognition).  Work and work Statistician.  Reproductive health. Screening  You may have the following tests or measurements:  Height, weight, and BMI.  Blood pressure.  Lipid and cholesterol levels. These may be checked every 5 years, or more frequently if you are over 58 years old.  Skin check.  Lung cancer screening. You may have this screening every year starting at age 8 if you have a 30-pack-year history of smoking and currently smoke or have quit within the past 15 years.  Fecal occult blood test (FOBT) of the stool. You may have this test every year starting at age 53.  Flexible sigmoidoscopy or colonoscopy. You may have a sigmoidoscopy every 5 years or a colonoscopy every 10 years starting at age  50.  Hepatitis C blood test.  Hepatitis B blood test.  Sexually transmitted disease (STD) testing.  Diabetes screening. This is done by checking your blood sugar (glucose) after you have not eaten for a while (fasting). You may have this done every 1-3 years.  Bone density scan. This is done to  screen for osteoporosis. You may have this done starting at age 52.  Mammogram. This may be done every 1-2 years. Talk to your health care provider about how often you should have regular mammograms. Talk with your health care provider about your test results, treatment options, and if necessary, the need for more tests. Vaccines  Your health care provider may recommend certain vaccines, such as:  Influenza vaccine. This is recommended every year.  Tetanus, diphtheria, and acellular pertussis (Tdap, Td) vaccine. You may need a Td booster every 10 years.  Zoster vaccine. You may need this after age 92.  Pneumococcal 13-valent conjugate (PCV13) vaccine. One dose is recommended after age 44.  Pneumococcal polysaccharide (PPSV23) vaccine. One dose is recommended after age 14. Talk to your health care provider about which screenings and vaccines you need and how often you need them. This information is not intended to replace advice given to you by your health care provider. Make sure you discuss any questions you have with your health care provider. Document Released: 11/25/2015 Document Revised: 07/18/2016 Document Reviewed: 08/30/2015 Elsevier Interactive Patient Education  2017 [employer] Prevention in the Home Falls can cause injuries. They can happen to people of all ages. There are many things you can do to make your home safe and to help prevent falls. What can I do on the outside of my home?  Regularly fix the edges of walkways and driveways and fix any cracks.  Remove anything that might make you trip as you walk through a door, such as a raised step or threshold.  Trim any bushes or trees on the path to your home.  Use bright outdoor lighting.  Clear any walking paths of anything that might make someone trip, such as rocks or tools.  Regularly check to see if handrails are loose or broken. Make sure that both sides of any steps have handrails.  Any raised decks  and porches should have guardrails on the edges.  Have any leaves, snow, or ice cleared regularly.  Use sand or salt on walking paths during winter.  Clean up any spills in your garage right away. This includes oil or grease spills. What can I do in the bathroom?  Use night lights.  Install grab bars by the toilet and in the tub and shower. Do not use towel bars as grab bars.  Use non-skid mats or decals in the tub or shower.  If you need to sit down in the shower, use a plastic, non-slip stool.  Keep the floor dry. Clean up any water that spills on the floor as soon as it happens.  Remove soap buildup in the tub or shower regularly.  Attach bath mats securely with double-sided non-slip rug tape.  Do not have throw rugs and other things on the floor that can make you trip. What can I do in the bedroom?  Use night lights.  Make sure that you have a light by your bed that is easy to reach.  Do not use any sheets or blankets that are too big for your bed. They should not hang down onto the floor.  Have a firm chair that  has side arms. You can use this for support while you get dressed.  Do not have throw rugs and other things on the floor that can make you trip. What can I do in the kitchen?  Clean up any spills right away.  Avoid walking on wet floors.  Keep items that you use a lot in easy-to-reach places.  If you need to reach something above you, use a strong step stool that has a grab bar.  Keep electrical cords out of the way.  Do not use floor polish or wax that makes floors slippery. If you must use wax, use non-skid floor wax.  Do not have throw rugs and other things on the floor that can make you trip. What can I do with my stairs?  Do not leave any items on the stairs.  Make sure that there are handrails on both sides of the stairs and use them. Fix handrails that are broken or loose. Make sure that handrails are as long as the stairways.  Check any  carpeting to make sure that it is firmly attached to the stairs. Fix any carpet that is loose or worn.  Avoid having throw rugs at the top or bottom of the stairs. If you do have throw rugs, attach them to the floor with carpet tape.  Make sure that you have a light switch at the top of the stairs and the bottom of the stairs. If you do not have them, ask someone to add them for you. What else can I do to help prevent falls?  Wear shoes that:  Do not have high heels.  Have rubber bottoms.  Are comfortable and fit you well.  Are closed at the toe. Do not wear sandals.  If you use a stepladder:  Make sure that it is fully opened. Do not climb a closed stepladder.  Make sure that both sides of the stepladder are locked into place.  Ask someone to hold it for you, if possible.  Clearly mark and make sure that you can see:  Any grab bars or handrails.  First and last steps.  Where the edge of each step is.  Use tools that help you move around (mobility aids) if they are needed. These include:  Canes.  Walkers.  Scooters.  Crutches.  Turn on the lights when you go into a dark area. Replace any light bulbs as soon as they burn out.  Set up your furniture so you have a clear path. Avoid moving your furniture around.  If any of your floors are uneven, fix them.  If there are any pets around you, be aware of where they are.  Review your medicines with your doctor. Some medicines can make you feel dizzy. This can increase your chance of falling. Ask your doctor what other things that you can do to help prevent falls. This information is not intended to replace advice given to you by your health care provider. Make sure you discuss any questions you have with your health care provider. Document Released: 08/25/2009 Document Revised: 04/05/2016 Document Reviewed: 12/03/2014 Elsevier Interactive Patient Education  2017 [employer].

## 2020-08-18 ENCOUNTER — Telehealth (INDEPENDENT_AMBULATORY_CARE_PROVIDER_SITE_OTHER): Payer: Medicare Other | Admitting: Internal Medicine

## 2020-08-18 ENCOUNTER — Encounter: Payer: Self-pay | Admitting: Internal Medicine

## 2020-08-18 DIAGNOSIS — E039 Hypothyroidism, unspecified: Secondary | ICD-10-CM

## 2020-08-18 DIAGNOSIS — R634 Abnormal weight loss: Secondary | ICD-10-CM

## 2020-08-18 DIAGNOSIS — F439 Reaction to severe stress, unspecified: Secondary | ICD-10-CM

## 2020-08-18 DIAGNOSIS — E559 Vitamin D deficiency, unspecified: Secondary | ICD-10-CM | POA: Diagnosis not present

## 2020-08-18 DIAGNOSIS — E7800 Pure hypercholesterolemia, unspecified: Secondary | ICD-10-CM | POA: Diagnosis not present

## 2020-08-18 DIAGNOSIS — J42 Unspecified chronic bronchitis: Secondary | ICD-10-CM

## 2020-08-18 DIAGNOSIS — I10 Essential (primary) hypertension: Secondary | ICD-10-CM
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
## 2020-08-23 ENCOUNTER — Telehealth: Payer: Self-pay | Admitting: *Deleted

## 2020-08-23 NOTE — Telephone Encounter (Signed)
Please place future orders for lab appt.  

## 2020-08-24 ENCOUNTER — Other Ambulatory Visit: Payer: Self-pay

## 2020-08-24 ENCOUNTER — Encounter: Payer: Self-pay | Admitting: Internal Medicine

## 2020-08-24 ENCOUNTER — Other Ambulatory Visit (INDEPENDENT_AMBULATORY_CARE_PROVIDER_SITE_OTHER): Payer: Medicare Other

## 2020-08-24 DIAGNOSIS — Z23 Encounter for immunization: Secondary | ICD-10-CM | POA: Diagnosis not present

## 2020-08-24 DIAGNOSIS — I10 Essential (primary) hypertension: Secondary | ICD-10-CM | POA: Diagnosis not present

## 2020-08-24 DIAGNOSIS — E7800 Pure hypercholesterolemia, unspecified: Secondary | ICD-10-CM

## 2020-08-24 DIAGNOSIS — E039 Hypothyroidism, unspecified: Secondary | ICD-10-CM

## 2020-08-24 DIAGNOSIS — E559 Vitamin D deficiency, unspecified: Secondary | ICD-10-CM | POA: Diagnosis not present

## 2020-08-24 LAB — LIPID PANEL
Cholesterol: 212 mg/dL — ABNORMAL HIGH (ref 0–200)
HDL: 94.3 mg/dL (ref >39.00)
LDL Cholesterol: 104 mg/dL — ABNORMAL HIGH (ref 0–99)
NonHDL: 118.03
Total CHOL/HDL Ratio: 2
Triglycerides: 70 mg/dL (ref 0.0–149.0)
VLDL: 14 mg/dL (ref 0.0–40.0)

## 2020-08-24 LAB — BASIC METABOLIC PANEL
BUN: 15 mg/dL (ref 6–23)
CO2: 28 mEq/L (ref 19–32)
Calcium: 9.3 mg/dL (ref 8.4–10.5)
Chloride: 104 mEq/L (ref 96–112)
Creatinine, Ser: 0.81 mg/dL (ref 0.40–1.20)
GFR: 65.96 mL/min (ref >60.00)
Glucose, Bld: 96 mg/dL (ref 70–99)
Potassium: 3.9 mEq/L (ref 3.5–5.1)
Sodium: 140 mEq/L (ref 135–145)

## 2020-08-24 LAB — VITAMIN D 25 HYDROXY (VIT D DEFICIENCY, FRACTURES): VITD: 17.32 ng/mL — ABNORMAL LOW (ref 30.00–100.00)

## 2020-08-24 LAB — HEPATIC FUNCTION PANEL
ALT: 8 U/L (ref 0–35)
AST: 17 U/L (ref 0–37)
Albumin: 4.2 g/dL (ref 3.5–5.2)
Alkaline Phosphatase: 109 U/L (ref 39–117)
Bilirubin, Direct: 0.1 mg/dL (ref 0.0–0.3)
Total Bilirubin: 0.7 mg/dL (ref 0.2–1.2)
Total Protein: 7.2 g/dL (ref 6.0–8.3)

## 2020-08-24 LAB — TSH: TSH: 0.65 u[IU]/mL (ref 0.35–4.50)

## 2020-08-24 NOTE — Assessment & Plan Note (Signed)
Doing well.  Has good support.  Follow.

## 2020-08-24 NOTE — Assessment & Plan Note (Signed)
Follow vitamin d level.   

## 2020-08-24 NOTE — Assessment & Plan Note (Signed)
On thyroid replacement.  Follow tsh.  

## 2020-08-24 NOTE — Assessment & Plan Note (Signed)
Weight stable.  Eating well.  Follow.  

## 2020-08-24 NOTE — Addendum Note (Signed)
Addended by: Tor Netters I on: 08/24/2020 08:50 AM   Modules accepted: Orders

## 2020-08-24 NOTE — Telephone Encounter (Signed)
Orders placed for labs

## 2020-08-24 NOTE — Assessment & Plan Note (Signed)
On pravastatin.  Follow lipid panel and liver function tests.   

## 2020-08-24 NOTE — Assessment & Plan Note (Signed)
Has been evaluated and followed by pulmonary.  Breathing stable.  Was placed on anoro.  Follow.

## 2020-08-24 NOTE — Assessment & Plan Note (Signed)
Blood pressure has been doing well.  Her blood pressure check 125/72.  Continue losartan and amlodipine.  Follow pressures.  Follow metabolic panel.

## 2020-09-06 ENCOUNTER — Other Ambulatory Visit: Payer: Self-pay | Admitting: Internal Medicine

## 2020-12-22 ENCOUNTER — Other Ambulatory Visit: Payer: Self-pay

## 2020-12-22 ENCOUNTER — Ambulatory Visit (INDEPENDENT_AMBULATORY_CARE_PROVIDER_SITE_OTHER): Payer: Medicare Other | Admitting: Internal Medicine

## 2020-12-22 ENCOUNTER — Encounter: Payer: Self-pay | Admitting: Internal Medicine

## 2020-12-22 DIAGNOSIS — E039 Hypothyroidism, unspecified: Secondary | ICD-10-CM

## 2020-12-22 DIAGNOSIS — E7800 Pure hypercholesterolemia, unspecified: Secondary | ICD-10-CM | POA: Diagnosis not present

## 2020-12-22 DIAGNOSIS — I10 Essential (primary) hypertension: Secondary | ICD-10-CM

## 2020-12-22 DIAGNOSIS — K219 Gastro-esophageal reflux disease without esophagitis: Secondary | ICD-10-CM

## 2020-12-22 DIAGNOSIS — J42 Unspecified chronic bronchitis: Secondary | ICD-10-CM | POA: Diagnosis not present

## 2020-12-22 DIAGNOSIS — F439 Reaction to severe stress, unspecified: Secondary | ICD-10-CM

## 2020-12-22 MED ORDER — HYDRALAZINE HCL 10 MG PO TABS: 10.0000 mg | ORAL_TABLET | Freq: Three times a day (TID) | ORAL | 3 refills | Status: DC
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
## 2020-12-24 ENCOUNTER — Encounter: Payer: Self-pay | Admitting: Internal Medicine

## 2020-12-24 NOTE — Assessment & Plan Note (Signed)
On thyroid replacement.  Follow tsh.  

## 2020-12-24 NOTE — Assessment & Plan Note (Signed)
On pravastatin.  Follow lipid panel and liver function tests.   

## 2020-12-24 NOTE — Assessment & Plan Note (Signed)
Blood pressure remains elevated.  On losartan and amlodipine.  Add hydralazine 10mg .  Follow pressures.  Follow metabolic panel.

## 2020-12-24 NOTE — Assessment & Plan Note (Signed)
Has been evaluated and followed by pulmonary.  Breathing stable.

## 2020-12-24 NOTE — Assessment & Plan Note (Signed)
Has good support.  Overall doing well.  Follow.

## 2020-12-24 NOTE — Assessment & Plan Note (Signed)
Controlled on protonix.   

## 2020-12-30 ENCOUNTER — Other Ambulatory Visit: Payer: Self-pay | Admitting: Internal Medicine

## 2021-02-06 ENCOUNTER — Other Ambulatory Visit: Payer: Self-pay

## 2021-02-06 ENCOUNTER — Encounter: Payer: Self-pay | Admitting: Internal Medicine

## 2021-02-06 ENCOUNTER — Ambulatory Visit (INDEPENDENT_AMBULATORY_CARE_PROVIDER_SITE_OTHER): Payer: Medicare Other | Admitting: Internal Medicine

## 2021-02-06 VITALS — BP 132/71 | HR 89 | Temp 98.1°F | Resp 16 | Ht 64.0 in | Wt 120.0 lb

## 2021-02-06 DIAGNOSIS — I10 Essential (primary) hypertension: Secondary | ICD-10-CM | POA: Diagnosis not present

## 2021-02-06 DIAGNOSIS — J42 Unspecified chronic bronchitis: Secondary | ICD-10-CM | POA: Diagnosis not present

## 2021-02-06 DIAGNOSIS — E7800 Pure hypercholesterolemia, unspecified: Secondary | ICD-10-CM | POA: Diagnosis not present

## 2021-02-06 DIAGNOSIS — E559 Vitamin D deficiency, unspecified: Secondary | ICD-10-CM | POA: Diagnosis not present

## 2021-02-06 DIAGNOSIS — R634 Abnormal weight loss: Secondary | ICD-10-CM

## 2021-02-06 DIAGNOSIS — I712 Thoracic aortic aneurysm, without rupture, unspecified: Secondary | ICD-10-CM

## 2021-02-06 DIAGNOSIS — E039 Hypothyroidism, unspecified: Secondary | ICD-10-CM | POA: Diagnosis not present

## 2021-02-06 DIAGNOSIS — K219 Gastro-esophageal reflux disease without esophagitis: Secondary | ICD-10-CM

## 2021-02-06 DIAGNOSIS — R350 Frequency of micturition: Secondary | ICD-10-CM | POA: Diagnosis not present

## 2021-02-06 LAB — URINALYSIS, ROUTINE W REFLEX MICROSCOPIC
Bilirubin Urine: NEGATIVE
Hgb urine dipstick: NEGATIVE
Ketones, ur: NEGATIVE
Nitrite: NEGATIVE
Specific Gravity, Urine: 1.01 (ref 1.000–1.030)
Total Protein, Urine: NEGATIVE
Urine Glucose: NEGATIVE
Urobilinogen, UA: 0.2 (ref 0.0–1.0)
pH: 6 (ref 5.0–8.0)
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
## 2021-02-07 ENCOUNTER — Encounter: Payer: Self-pay | Admitting: Internal Medicine

## 2021-02-07 ENCOUNTER — Other Ambulatory Visit: Payer: Medicare Other

## 2021-02-07 DIAGNOSIS — I712 Thoracic aortic aneurysm, without rupture, unspecified: Secondary | ICD-10-CM | POA: Insufficient documentation

## 2021-02-07 DIAGNOSIS — R350 Frequency of micturition: Secondary | ICD-10-CM

## 2021-02-07 NOTE — Assessment & Plan Note (Signed)
Vitamin D level low last check.  Continue vitamin D supplements.

## 2021-02-07 NOTE — Assessment & Plan Note (Signed)
Weight stable from last check.  Eating well.  Follow.

## 2021-02-07 NOTE — Assessment & Plan Note (Signed)
On pravastatin.  Low cholesterol diet and exercise.  Follow lipid panel and liver function tests.   

## 2021-02-07 NOTE — Assessment & Plan Note (Signed)
No acid reflux. On protonix.

## 2021-02-07 NOTE — Assessment & Plan Note (Signed)
Stable on last CT.  See Dr Mortimer Fries note 12/2019.

## 2021-02-07 NOTE — Assessment & Plan Note (Signed)
Check urine to confirm no infectoin.

## 2021-02-07 NOTE — Assessment & Plan Note (Signed)
Blood pressure improved.  Outside check 132/71.  Continue current medication regimen - amlodipine, hydralazine and losartan.  Follow pressures.  Follow metabolic panel.

## 2021-02-07 NOTE — Assessment & Plan Note (Signed)
On thyroid replacement.  Follow tsh.  

## 2021-02-07 NOTE — Assessment & Plan Note (Signed)
Has been evaluated by anoro.  Remains on anoro.

## 2021-02-09 LAB — URINE CULTURE
MICRO NUMBER:: 11705310
SPECIMEN QUALITY:: ADEQUATE

## 2021-02-10 ENCOUNTER — Telehealth: Payer: Self-pay

## 2021-02-10 MED ORDER — CEFDINIR 300 MG PO CAPS: 300.0000 mg | ORAL_CAPSULE | Freq: Two times a day (BID) | ORAL | 0 refills | Status: DC

## 2021-02-10 NOTE — Telephone Encounter (Signed)
-----   Message from Einar Pheasant, MD sent at 02/10/2021  5:56 AM EDT ----- Notify pt that her urine did reveal a bacteria in her urine.  Is she still having urinary symptoms?  If so, given she has noticed a change in her urine and given bacteria present, will treat with abx. Please confirm nkda.  If no, then start omnicef 300mg  bid x 5 days.  Eat yogurt/take probiotic daily while on abx and for two weeks after completing abx.

## 2021-02-10 NOTE — Telephone Encounter (Signed)
Received a call back from [place]. Kaitlyn Bowen stated that she has been having urinary frequency since February and that she has felt very little burning when urinating. Patient is aware that an antibiotic has been sent. Omnicef 300mg  has been sent to [facility] in [place] per patient request. Kaitlyn Bowen verbalized understanding and had no further questions.

## 2021-03-21 ENCOUNTER — Other Ambulatory Visit: Payer: Self-pay | Admitting: Internal Medicine

## 2021-05-03 ENCOUNTER — Other Ambulatory Visit: Payer: Self-pay | Admitting: Internal Medicine

## 2021-05-10 ENCOUNTER — Other Ambulatory Visit: Payer: Self-pay

## 2021-05-10 ENCOUNTER — Other Ambulatory Visit (INDEPENDENT_AMBULATORY_CARE_PROVIDER_SITE_OTHER): Payer: Medicare Other

## 2021-05-10 DIAGNOSIS — E7800 Pure hypercholesterolemia, unspecified: Secondary | ICD-10-CM

## 2021-05-10 DIAGNOSIS — I10 Essential (primary) hypertension: Secondary | ICD-10-CM | POA: Diagnosis not present

## 2021-05-10 DIAGNOSIS — K219 Gastro-esophageal reflux disease without esophagitis: Secondary | ICD-10-CM

## 2021-05-10 LAB — CBC WITH DIFFERENTIAL/PLATELET
Basophils Absolute: 0 10*3/uL (ref 0.0–0.1)
Basophils Relative: 0.7 % (ref 0.0–3.0)
Eosinophils Absolute: 0.1 10*3/uL (ref 0.0–0.7)
Eosinophils Relative: 2.2 % (ref 0.0–5.0)
HCT: 40.2 % (ref 36.0–46.0)
Hemoglobin: 13.5 g/dL (ref 12.0–15.0)
Lymphocytes Relative: 34.4 % (ref 12.0–46.0)
Lymphs Abs: 1.7 10*3/uL (ref 0.7–4.0)
MCHC: 33.6 g/dL (ref 30.0–36.0)
MCV: 86.6 fl (ref 78.0–100.0)
Monocytes Absolute: 0.5 10*3/uL (ref 0.1–1.0)
Monocytes Relative: 11.4 % (ref 3.0–12.0)
Neutro Abs: 2.5 10*3/uL (ref 1.4–7.7)
Neutrophils Relative %: 51.3 % (ref 43.0–77.0)
Platelets: 250 10*3/uL (ref 150.0–400.0)
RBC: 4.65 Mil/uL (ref 3.87–5.11)
RDW: 13.3 % (ref 11.5–15.5)
WBC: 4.8 10*3/uL (ref 4.0–10.5)

## 2021-05-10 LAB — LIPID PANEL
Cholesterol: 184 mg/dL (ref 0–200)
HDL: 83.7 mg/dL (ref >39.00)
LDL Cholesterol: 88 mg/dL (ref 0–99)
NonHDL: 100.39
Total CHOL/HDL Ratio: 2
Triglycerides: 60 mg/dL (ref 0.0–149.0)
VLDL: 12 mg/dL (ref 0.0–40.0)

## 2021-05-10 LAB — HEPATIC FUNCTION PANEL
ALT: 7 U/L (ref 0–35)
AST: 16 U/L (ref 0–37)
Albumin: 4.1 g/dL (ref 3.5–5.2)
Alkaline Phosphatase: 104 U/L (ref 39–117)
Bilirubin, Direct: 0.2 mg/dL (ref 0.0–0.3)
Total Bilirubin: 1 mg/dL (ref 0.2–1.2)
Total Protein: 6.8 g/dL (ref 6.0–8.3)

## 2021-05-10 LAB — BASIC METABOLIC PANEL
BUN: 18 mg/dL (ref 6–23)
CO2: 29 mEq/L (ref 19–32)
Calcium: 9.1 mg/dL (ref 8.4–10.5)
Chloride: 104 mEq/L (ref 96–112)
Creatinine, Ser: 0.81 mg/dL (ref 0.40–1.20)
GFR: 65.8 mL/min (ref >60.00)
Glucose, Bld: 94 mg/dL (ref 70–99)
Potassium: 3.7 mEq/L (ref 3.5–5.1)
Sodium: 140 mEq/L (ref 135–145)

## 2021-05-12 ENCOUNTER — Ambulatory Visit (INDEPENDENT_AMBULATORY_CARE_PROVIDER_SITE_OTHER): Payer: Medicare Other | Admitting: Internal Medicine

## 2021-05-12 ENCOUNTER — Other Ambulatory Visit: Payer: Self-pay

## 2021-05-12 ENCOUNTER — Encounter: Payer: Self-pay | Admitting: Internal Medicine

## 2021-05-12 VITALS — BP 120/84 | HR 63 | Temp 98.1°F | Ht 64.02 in | Wt 117.2 lb

## 2021-05-12 DIAGNOSIS — Z0000 Encounter for general adult medical examination without abnormal findings: Secondary | ICD-10-CM

## 2021-05-12 DIAGNOSIS — R634 Abnormal weight loss: Secondary | ICD-10-CM

## 2021-05-12 DIAGNOSIS — E559 Vitamin D deficiency, unspecified: Secondary | ICD-10-CM

## 2021-05-12 DIAGNOSIS — E7800 Pure hypercholesterolemia, unspecified: Secondary | ICD-10-CM

## 2021-05-12 DIAGNOSIS — E039 Hypothyroidism, unspecified: Secondary | ICD-10-CM

## 2021-05-12 DIAGNOSIS — Z1231 Encounter for screening mammogram for malignant neoplasm of breast: Secondary | ICD-10-CM

## 2021-05-12 DIAGNOSIS — K219 Gastro-esophageal reflux disease without esophagitis: Secondary | ICD-10-CM

## 2021-05-12 DIAGNOSIS — I10 Essential (primary) hypertension: Secondary | ICD-10-CM

## 2021-05-12 DIAGNOSIS — F439 Reaction to severe stress, unspecified: Secondary | ICD-10-CM

## 2021-05-12 DIAGNOSIS — R350 Frequency of micturition: Secondary | ICD-10-CM

## 2021-05-12 DIAGNOSIS — I712 Thoracic aortic aneurysm, without rupture, unspecified: Secondary | ICD-10-CM

## 2021-05-12 DIAGNOSIS — J42 Unspecified chronic bronchitis: Secondary | ICD-10-CM
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
## 2021-05-12 NOTE — Assessment & Plan Note (Signed)
Physical today 05/12/21.  Mammogram 04/20/20 - Birads I. Ordered mammogram today.  Colonoscopy 2011.

## 2021-05-13 ENCOUNTER — Encounter: Payer: Self-pay | Admitting: Internal Medicine

## 2021-05-13 NOTE — Assessment & Plan Note (Signed)
Eating well.  Feels good.  Follow.

## 2021-05-13 NOTE — Assessment & Plan Note (Signed)
Has good support.  Overall doing well.  Follow.

## 2021-05-13 NOTE — Assessment & Plan Note (Signed)
On pravastatin.  Low cholesterol diet and exercise.  Follow lipid panel and liver function tests.   

## 2021-05-13 NOTE — Assessment & Plan Note (Signed)
Continue anoro.  Breathing stable.

## 2021-05-13 NOTE — Assessment & Plan Note (Signed)
No acid reflux reported.  On Protonix. 

## 2021-05-13 NOTE — Assessment & Plan Note (Signed)
Blood pressure improved.  Outside checks as outlined.  Continue current medication regimen - amlodipine, hydralazine and losartan.  Follow pressures.  Follow metabolic panel.

## 2021-05-13 NOTE — Assessment & Plan Note (Signed)
Refer to Dr Kasa note.  Stable on last CT.  

## 2021-05-13 NOTE — Assessment & Plan Note (Signed)
On thyroid replacement.  Follow tsh.  

## 2021-05-13 NOTE — Assessment & Plan Note (Signed)
Recent urine revealed no evidence of infection.  Discussed treatment options.  We discussed the possibility of using Myrbetriq.  She wants to hold on any further intervention or testing at this point.  We will stop any further medication.  Follow-up.

## 2021-05-13 NOTE — Assessment & Plan Note (Signed)
Vitamin D level low last check.  Continue vitamin D supplements.

## 2021-06-21 ENCOUNTER — Other Ambulatory Visit: Payer: Self-pay | Admitting: Internal Medicine

## 2021-07-31 ENCOUNTER — Ambulatory Visit (INDEPENDENT_AMBULATORY_CARE_PROVIDER_SITE_OTHER): Payer: Medicare Other

## 2021-07-31 VITALS — Ht 64.02 in | Wt 117.0 lb

## 2021-07-31 DIAGNOSIS — Z0000 Encounter for general adult medical examination without abnormal findings: Secondary | ICD-10-CM | POA: Diagnosis not present
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
## 2021-07-31 NOTE — Patient Instructions (Addendum)
Kaitlyn Bowen , Thank you for taking time to come for your Medicare Wellness Visit. I appreciate your ongoing commitment to your health goals. Please review the following plan we discussed and let me know if I can assist you in the future.   These are the goals we discussed:  Goals      Healthy Lifestyle     Maintain a healthy diet, adequate water intake and using the treadmill for exercise.        This is a list of the screening recommended for you and due dates:  Health Maintenance  Topic Date Due   Mammogram  04/20/2021   COVID-19 Vaccine (1) 11/11/2021*   Zoster (Shingles) Vaccine (1 of 2) 11/11/2021*   Flu Shot  02/09/2022*   Tetanus Vaccine  07/31/2022*   DEXA scan (bone density measurement)  Completed   HPV Vaccine  Aged Out  *Topic was postponed. The date shown is not the original due date.    Advanced directives: End of life planning; Advance aging; Advanced directives discussed.  Copy of current HCPOA/Living Will requested.    Conditions/risks identified: none new.  Follow up in one year for your annual wellness visit    Preventive Care 65 Years and Older, Female Preventive care refers to lifestyle choices and visits with your health care provider that can promote health and wellness. What does preventive care include? A yearly physical exam. This is also called an annual well check. Dental exams once or twice a year. Routine eye exams. Ask your health care provider how often you should have your eyes checked. Personal lifestyle choices, including: Daily care of your teeth and gums. Regular physical activity. Eating a healthy diet. Avoiding tobacco and drug use. Limiting alcohol use. Practicing safe sex. Taking low-dose aspirin every day. Taking vitamin and mineral supplements as recommended by your health care provider. What happens during an annual well check? The services and screenings done by your health care provider during your annual well check will  depend on your age, overall health, lifestyle risk factors, and family history of disease. Counseling  Your health care provider may ask you questions about your: Alcohol use. Tobacco use. Drug use. Emotional well-being. Home and relationship well-being. Sexual activity. Eating habits. History of falls. Memory and ability to understand (cognition). Work and work Statistician. Reproductive health. Screening  You may have the following tests or measurements: Height, weight, and BMI. Blood pressure. Lipid and cholesterol levels. These may be checked every 5 years, or more frequently if you are over 26 years old. Skin check. Lung cancer screening. You may have this screening every year starting at age 22 if you have a 30-pack-year history of smoking and currently smoke or have quit within the past 15 years. Fecal occult blood test (FOBT) of the stool. You may have this test every year starting at age 55. Flexible sigmoidoscopy or colonoscopy. You may have a sigmoidoscopy every 5 years or a colonoscopy every 10 years starting at age 40. Hepatitis C blood test. Hepatitis B blood test. Sexually transmitted disease (STD) testing. Diabetes screening. This is done by checking your blood sugar (glucose) after you have not eaten for a while (fasting). You may have this done every 1-3 years. Bone density scan. This is done to screen for osteoporosis. You may have this done starting at age 50. Mammogram. This may be done every 1-2 years. Talk to your health care provider about how often you should have regular mammograms. Talk with your health care  provider about your test results, treatment options, and if necessary, the need for more tests. Vaccines  Your health care provider may recommend certain vaccines, such as: Influenza vaccine. This is recommended every year. Tetanus, diphtheria, and acellular pertussis (Tdap, Td) vaccine. You may need a Td booster every 10 years. Zoster vaccine. You may  need this after age 74. Pneumococcal 13-valent conjugate (PCV13) vaccine. One dose is recommended after age 28. Pneumococcal polysaccharide (PPSV23) vaccine. One dose is recommended after age 61. Talk to your health care provider about which screenings and vaccines you need and how often you need them. This information is not intended to replace advice given to you by your health care provider. Make sure you discuss any questions you have with your health care provider. Document Released: 11/25/2015 Document Revised: 07/18/2016 Document Reviewed: 08/30/2015 Elsevier Interactive Patient Education  2017 [employer] Prevention in the Home Falls can cause injuries. They can happen to people of all ages. There are many things you can do to make your home safe and to help prevent falls. What can I do on the outside of my home? Regularly fix the edges of walkways and driveways and fix any cracks. Remove anything that might make you trip as you walk through a door, such as a raised step or threshold. Trim any bushes or trees on the path to your home. Use bright outdoor lighting. Clear any walking paths of anything that might make someone trip, such as rocks or tools. Regularly check to see if handrails are loose or broken. Make sure that both sides of any steps have handrails. Any raised decks and porches should have guardrails on the edges. Have any leaves, snow, or ice cleared regularly. Use sand or salt on walking paths during winter. Clean up any spills in your garage right away. This includes oil or grease spills. What can I do in the bathroom? Use night lights. Install grab bars by the toilet and in the tub and shower. Do not use towel bars as grab bars. Use non-skid mats or decals in the tub or shower. If you need to sit down in the shower, use a plastic, non-slip stool. Keep the floor dry. Clean up any water that spills on the floor as soon as it happens. Remove soap buildup in  the tub or shower regularly. Attach bath mats securely with double-sided non-slip rug tape. Do not have throw rugs and other things on the floor that can make you trip. What can I do in the bedroom? Use night lights. Make sure that you have a light by your bed that is easy to reach. Do not use any sheets or blankets that are too big for your bed. They should not hang down onto the floor. Have a firm chair that has side arms. You can use this for support while you get dressed. Do not have throw rugs and other things on the floor that can make you trip. What can I do in the kitchen? Clean up any spills right away. Avoid walking on wet floors. Keep items that you use a lot in easy-to-reach places. If you need to reach something above you, use a strong step stool that has a grab bar. Keep electrical cords out of the way. Do not use floor polish or wax that makes floors slippery. If you must use wax, use non-skid floor wax. Do not have throw rugs and other things on the floor that can make you trip. What can I  do with my stairs? Do not leave any items on the stairs. Make sure that there are handrails on both sides of the stairs and use them. Fix handrails that are broken or loose. Make sure that handrails are as long as the stairways. Check any carpeting to make sure that it is firmly attached to the stairs. Fix any carpet that is loose or worn. Avoid having throw rugs at the top or bottom of the stairs. If you do have throw rugs, attach them to the floor with carpet tape. Make sure that you have a light switch at the top of the stairs and the bottom of the stairs. If you do not have them, ask someone to add them for you. What else can I do to help prevent falls? Wear shoes that: Do not have high heels. Have rubber bottoms. Are comfortable and fit you well. Are closed at the toe. Do not wear sandals. If you use a stepladder: Make sure that it is fully opened. Do not climb a closed  stepladder. Make sure that both sides of the stepladder are locked into place. Ask someone to hold it for you, if possible. Clearly mark and make sure that you can see: Any grab bars or handrails. First and last steps. Where the edge of each step is. Use tools that help you move around (mobility aids) if they are needed. These include: Canes. Walkers. Scooters. Crutches. Turn on the lights when you go into a dark area. Replace any light bulbs as soon as they burn out. Set up your furniture so you have a clear path. Avoid moving your furniture around. If any of your floors are uneven, fix them. If there are any pets around you, be aware of where they are. Review your medicines with your doctor. Some medicines can make you feel dizzy. This can increase your chance of falling. Ask your doctor what other things that you can do to help prevent falls. This information is not intended to replace advice given to you by your health care provider. Make sure you discuss any questions you have with your health care provider. Document Released: 08/25/2009 Document Revised: 04/05/2016 Document Reviewed: 12/03/2014 Elsevier Interactive Patient Education  2017 [employer].

## 2021-09-02 ENCOUNTER — Other Ambulatory Visit: Payer: Self-pay | Admitting: Internal Medicine

## 2021-09-12 ENCOUNTER — Other Ambulatory Visit: Payer: Medicare Other

## 2021-09-13 ENCOUNTER — Other Ambulatory Visit: Payer: Self-pay | Admitting: Internal Medicine

## 2021-09-13 ENCOUNTER — Ambulatory Visit: Payer: Medicare Other | Admitting: Internal Medicine

## 2021-12-10 ENCOUNTER — Other Ambulatory Visit: Payer: Self-pay | Admitting: Internal Medicine

## 2022-01-19 ENCOUNTER — Telehealth: Payer: Self-pay | Admitting: Internal Medicine
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
## 2022-01-19 NOTE — Telephone Encounter (Signed)
No dpr on file to speak with daughter. Was not able to reach patient. ?

## 2022-01-23 ENCOUNTER — Ambulatory Visit (INDEPENDENT_AMBULATORY_CARE_PROVIDER_SITE_OTHER): Payer: Medicare Other | Admitting: Internal Medicine

## 2022-01-23 ENCOUNTER — Other Ambulatory Visit: Payer: Self-pay

## 2022-01-23 DIAGNOSIS — R634 Abnormal weight loss: Secondary | ICD-10-CM

## 2022-01-23 DIAGNOSIS — I10 Essential (primary) hypertension: Secondary | ICD-10-CM

## 2022-01-23 DIAGNOSIS — J42 Unspecified chronic bronchitis: Secondary | ICD-10-CM | POA: Diagnosis not present

## 2022-01-23 DIAGNOSIS — K219 Gastro-esophageal reflux disease without esophagitis: Secondary | ICD-10-CM

## 2022-01-23 DIAGNOSIS — E559 Vitamin D deficiency, unspecified: Secondary | ICD-10-CM | POA: Diagnosis not present

## 2022-01-23 DIAGNOSIS — E039 Hypothyroidism, unspecified: Secondary | ICD-10-CM

## 2022-01-23 DIAGNOSIS — E7800 Pure hypercholesterolemia, unspecified: Secondary | ICD-10-CM | POA: Diagnosis not present

## 2022-01-23 DIAGNOSIS — F439 Reaction to severe stress, unspecified: Secondary | ICD-10-CM

## 2022-01-23 DIAGNOSIS — Z9189 Other specified personal risk factors, not elsewhere classified: Secondary | ICD-10-CM

## 2022-01-23 DIAGNOSIS — I712 Thoracic aortic aneurysm, without rupture, unspecified: Secondary | ICD-10-CM | POA: Diagnosis not present
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
## 2022-01-23 NOTE — Telephone Encounter (Signed)
Pt has appt today

## 2022-01-28 ENCOUNTER — Encounter: Payer: Self-pay | Admitting: Internal Medicine

## 2022-01-28 DIAGNOSIS — Z9189 Other specified personal risk factors, not elsewhere classified: Secondary | ICD-10-CM | POA: Insufficient documentation

## 2022-01-28 NOTE — Assessment & Plan Note (Signed)
On thyroid replacement.  Follow tsh.  

## 2022-01-28 NOTE — Assessment & Plan Note (Signed)
Has been evaluated by anoro.  Remains on anoro.  ?

## 2022-01-28 NOTE — Assessment & Plan Note (Signed)
Has good support.  Overall doing well.  Follow.  ?

## 2022-01-28 NOTE — Assessment & Plan Note (Signed)
Discussed with her and her daughter today. Discussed given concerns raised, obtaining a driving evaluation.  She declines.  Discussed recommendation to hold on driving until evaluation.  She has friends and family that can transport her.   ?

## 2022-01-28 NOTE — Assessment & Plan Note (Signed)
On pravastatin.  Low cholesterol diet and exercise.  Follow lipid panel and liver function tests.   

## 2022-01-28 NOTE — Assessment & Plan Note (Signed)
Weight stable.  Eating.  Follow.  ?

## 2022-01-28 NOTE — Assessment & Plan Note (Signed)
Blood pressure improved.  Continue current medication regimen - amlodipine, hydralazine and losartan.  Follow pressures.  Follow metabolic panel.  ?

## 2022-01-28 NOTE — Assessment & Plan Note (Signed)
Refer to Dr Kasa note.  Stable on last CT.  

## 2022-01-28 NOTE — Assessment & Plan Note (Signed)
Continue vitamin D supplements.  

## 2022-01-28 NOTE — Assessment & Plan Note (Signed)
No acid reflux reported.  On Protonix. 

## 2022-01-30 ENCOUNTER — Other Ambulatory Visit (INDEPENDENT_AMBULATORY_CARE_PROVIDER_SITE_OTHER): Payer: Medicare Other

## 2022-01-30 ENCOUNTER — Other Ambulatory Visit: Payer: Self-pay

## 2022-01-30 DIAGNOSIS — E559 Vitamin D deficiency, unspecified: Secondary | ICD-10-CM

## 2022-01-30 DIAGNOSIS — I10 Essential (primary) hypertension: Secondary | ICD-10-CM | POA: Diagnosis not present

## 2022-01-30 DIAGNOSIS — E7800 Pure hypercholesterolemia, unspecified: Secondary | ICD-10-CM | POA: Diagnosis not present

## 2022-01-30 LAB — BASIC METABOLIC PANEL
BUN: 18 mg/dL (ref 6–23)
CO2: 28 mEq/L (ref 19–32)
Calcium: 8.8 mg/dL (ref 8.4–10.5)
Chloride: 105 mEq/L (ref 96–112)
Creatinine, Ser: 0.84 mg/dL (ref 0.40–1.20)
GFR: 62.67 mL/min (ref >60.00)
Glucose, Bld: 89 mg/dL (ref 70–99)
Potassium: 3.9 mEq/L (ref 3.5–5.1)
Sodium: 141 mEq/L (ref 135–145)

## 2022-01-30 LAB — LIPID PANEL
Cholesterol: 153 mg/dL (ref 0–200)
HDL: 71 mg/dL (ref >39.00)
LDL Cholesterol: 69 mg/dL (ref 0–99)
NonHDL: 81.96
Total CHOL/HDL Ratio: 2
Triglycerides: 65 mg/dL (ref 0.0–149.0)
VLDL: 13 mg/dL (ref 0.0–40.0)

## 2022-01-30 LAB — HEPATIC FUNCTION PANEL
ALT: 9 U/L (ref 0–35)
AST: 17 U/L (ref 0–37)
Albumin: 4.1 g/dL (ref 3.5–5.2)
Alkaline Phosphatase: 89 U/L (ref 39–117)
Bilirubin, Direct: 0.1 mg/dL (ref 0.0–0.3)
Total Bilirubin: 0.5 mg/dL (ref 0.2–1.2)
Total Protein: 6.6 g/dL (ref 6.0–8.3)

## 2022-01-30 LAB — VITAMIN D 25 HYDROXY (VIT D DEFICIENCY, FRACTURES): VITD: 18.42 ng/mL — ABNORMAL LOW (ref 30.00–100.00)

## 2022-03-08 ENCOUNTER — Other Ambulatory Visit: Payer: Self-pay | Admitting: Internal Medicine

## 2022-03-26 ENCOUNTER — Encounter: Payer: Self-pay | Admitting: Internal Medicine

## 2022-03-26 ENCOUNTER — Ambulatory Visit (INDEPENDENT_AMBULATORY_CARE_PROVIDER_SITE_OTHER): Payer: Medicare Other | Admitting: Internal Medicine

## 2022-03-26 DIAGNOSIS — R350 Frequency of micturition: Secondary | ICD-10-CM | POA: Diagnosis not present

## 2022-03-26 DIAGNOSIS — K219 Gastro-esophageal reflux disease without esophagitis: Secondary | ICD-10-CM | POA: Diagnosis not present

## 2022-03-26 DIAGNOSIS — J42 Unspecified chronic bronchitis: Secondary | ICD-10-CM

## 2022-03-26 DIAGNOSIS — E7800 Pure hypercholesterolemia, unspecified: Secondary | ICD-10-CM

## 2022-03-26 DIAGNOSIS — I10 Essential (primary) hypertension: Secondary | ICD-10-CM

## 2022-03-26 DIAGNOSIS — I712 Thoracic aortic aneurysm, without rupture, unspecified: Secondary | ICD-10-CM

## 2022-03-26 DIAGNOSIS — E039 Hypothyroidism, unspecified: Secondary | ICD-10-CM

## 2022-03-26 DIAGNOSIS — F439 Reaction to severe stress, unspecified: Secondary | ICD-10-CM
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
## 2022-04-06 NOTE — Assessment & Plan Note (Signed)
Blood pressure as outlined. Continue current medication regimen - amlodipine, hydralazine and losartan.  Follow pressures.  Follow metabolic panel.  

## 2022-04-06 NOTE — Assessment & Plan Note (Signed)
No acid reflux reported.  On Protonix. 

## 2022-04-06 NOTE — Assessment & Plan Note (Signed)
Has been evaluated by pulmonary.  Remains on anoro.

## 2022-04-06 NOTE — Assessment & Plan Note (Signed)
Has good support.  Overall doing well.  Follow.

## 2022-04-06 NOTE — Assessment & Plan Note (Signed)
Refer to Dr Kasa note.  Stable on last CT.  

## 2022-04-06 NOTE — Assessment & Plan Note (Signed)
On thyroid replacement.  Follow tsh.  

## 2022-04-06 NOTE — Assessment & Plan Note (Signed)
On pravastatin.  Low cholesterol diet and exercise.  Follow lipid panel and liver function tests.   

## 2022-04-06 NOTE — Assessment & Plan Note (Signed)
Urinary symptoms as outlined.  Discussed treatment options, including kegel exercises, further testing, medication and pelvic floor therapy.  Wants to monitor.

## 2022-04-25 ENCOUNTER — Other Ambulatory Visit: Payer: Self-pay | Admitting: Internal Medicine

## 2022-05-07 ENCOUNTER — Telehealth: Payer: Self-pay | Admitting: Internal Medicine

## 2022-05-08 ENCOUNTER — Other Ambulatory Visit: Payer: Self-pay

## 2022-05-08 DIAGNOSIS — Z1231 Encounter for screening mammogram for malignant neoplasm of breast: Secondary | ICD-10-CM

## 2022-06-06 ENCOUNTER — Other Ambulatory Visit: Payer: Self-pay | Admitting: Internal Medicine

## 2022-06-25 ENCOUNTER — Other Ambulatory Visit (INDEPENDENT_AMBULATORY_CARE_PROVIDER_SITE_OTHER): Payer: Medicare Other

## 2022-06-25 DIAGNOSIS — E039 Hypothyroidism, unspecified: Secondary | ICD-10-CM | POA: Diagnosis not present

## 2022-06-25 DIAGNOSIS — E7800 Pure hypercholesterolemia, unspecified: Secondary | ICD-10-CM | POA: Diagnosis not present

## 2022-06-25 DIAGNOSIS — I10 Essential (primary) hypertension: Secondary | ICD-10-CM | POA: Diagnosis not present

## 2022-06-25 LAB — CBC WITH DIFFERENTIAL/PLATELET
Basophils Absolute: 0 10*3/uL (ref 0.0–0.1)
Basophils Relative: 0.6 % (ref 0.0–3.0)
Eosinophils Absolute: 0.1 10*3/uL (ref 0.0–0.7)
Eosinophils Relative: 1.2 % (ref 0.0–5.0)
HCT: 39.4 % (ref 36.0–46.0)
Hemoglobin: 13.2 g/dL (ref 12.0–15.0)
Lymphocytes Relative: 34 % (ref 12.0–46.0)
Lymphs Abs: 1.6 10*3/uL (ref 0.7–4.0)
MCHC: 33.4 g/dL (ref 30.0–36.0)
MCV: 89.5 fl (ref 78.0–100.0)
Monocytes Absolute: 0.5 10*3/uL (ref 0.1–1.0)
Monocytes Relative: 10.2 % (ref 3.0–12.0)
Neutro Abs: 2.6 10*3/uL (ref 1.4–7.7)
Neutrophils Relative %: 54 % (ref 43.0–77.0)
Platelets: 254 10*3/uL (ref 150.0–400.0)
RBC: 4.4 Mil/uL (ref 3.87–5.11)
RDW: 13.2 % (ref 11.5–15.5)
WBC: 4.8 10*3/uL (ref 4.0–10.5)

## 2022-06-25 LAB — LIPID PANEL
Cholesterol: 188 mg/dL (ref 0–200)
HDL: 83.1 mg/dL (ref >39.00)
LDL Cholesterol: 95 mg/dL (ref 0–99)
NonHDL: 104.49
Total CHOL/HDL Ratio: 2
Triglycerides: 46 mg/dL (ref 0.0–149.0)
VLDL: 9.2 mg/dL (ref 0.0–40.0)

## 2022-06-25 LAB — BASIC METABOLIC PANEL
BUN: 25 mg/dL — ABNORMAL HIGH (ref 6–23)
CO2: 26 mEq/L (ref 19–32)
Calcium: 9 mg/dL (ref 8.4–10.5)
Chloride: 103 mEq/L (ref 96–112)
Creatinine, Ser: 0.82 mg/dL (ref 0.40–1.20)
GFR: 64.32 mL/min (ref >60.00)
Glucose, Bld: 93 mg/dL (ref 70–99)
Potassium: 4.2 mEq/L (ref 3.5–5.1)
Sodium: 139 mEq/L (ref 135–145)

## 2022-06-25 LAB — TSH: TSH: 0.44 u[IU]/mL (ref 0.35–5.50)

## 2022-06-25 LAB — HEPATIC FUNCTION PANEL
ALT: 7 U/L (ref 0–35)
AST: 18 U/L (ref 0–37)
Albumin: 4.2 g/dL (ref 3.5–5.2)
Alkaline Phosphatase: 85 U/L (ref 39–117)
Bilirubin, Direct: 0.2 mg/dL (ref 0.0–0.3)
Total Bilirubin: 0.8 mg/dL (ref 0.2–1.2)
Total Protein: 6.8 g/dL (ref 6.0–8.3)

## 2022-06-28 ENCOUNTER — Encounter: Payer: Medicare Other | Admitting: Internal Medicine

## 2022-07-13 ENCOUNTER — Encounter: Payer: Medicare Other | Admitting: Internal Medicine

## 2022-07-27 ENCOUNTER — Encounter: Payer: Medicare Other | Admitting: Internal Medicine

## 2022-08-08 ENCOUNTER — Ambulatory Visit (INDEPENDENT_AMBULATORY_CARE_PROVIDER_SITE_OTHER): Payer: Medicare Other

## 2022-08-08 DIAGNOSIS — Z0000 Encounter for general adult medical examination without abnormal findings: Secondary | ICD-10-CM | POA: Diagnosis not present
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
## 2022-08-08 NOTE — Patient Instructions (Signed)
Kaitlyn Bowen , Thank you for taking time to come for your Medicare Wellness Visit. I appreciate your ongoing commitment to your health goals. Please review the following plan we discussed and let me know if I can assist you in the future.   These are the goals we discussed:  Goals      Healthy Lifestyle     Maintain a healthy diet, adequate water intake and using the treadmill for exercise.     Patient Stated     Maintain health         This is a list of the screening recommended for you and due dates:  Health Maintenance  Topic Date Due   Tetanus Vaccine  Never done   Mammogram  04/20/2021   Flu Shot  06/12/2022   COVID-19 Vaccine (1) 08/24/2022*   Zoster (Shingles) Vaccine (1 of 2) 11/07/2022*   Pneumonia Vaccine  Completed   DEXA scan (bone density measurement)  Completed   HPV Vaccine  Aged Out  *Topic was postponed. The date shown is not the original due date.    Advanced directives: Advance directive discussed with you today. Even though you declined this today please call our office should you change your mind and we can give you the proper paperwork for you to fill out.  Conditions/risks identified: maintain health   Next appointment: Follow up in one year for your annual wellness visit    Preventive Care 65 Years and Older, Female Preventive care refers to lifestyle choices and visits with your health care provider that can promote health and wellness. What does preventive care include? A yearly physical exam. This is also called an annual well check. Dental exams once or twice a year. Routine eye exams. Ask your health care provider how often you should have your eyes checked. Personal lifestyle choices, including: Daily care of your teeth and gums. Regular physical activity. Eating a healthy diet. Avoiding tobacco and drug use. Limiting alcohol use. Practicing safe sex. Taking low-dose aspirin every day. Taking vitamin and mineral supplements as  recommended by your health care provider. What happens during an annual well check? The services and screenings done by your health care provider during your annual well check will depend on your age, overall health, lifestyle risk factors, and family history of disease. Counseling  Your health care provider may ask you questions about your: Alcohol use. Tobacco use. Drug use. Emotional well-being. Home and relationship well-being. Sexual activity. Eating habits. History of falls. Memory and ability to understand (cognition). Work and work Statistician. Reproductive health. Screening  You may have the following tests or measurements: Height, weight, and BMI. Blood pressure. Lipid and cholesterol levels. These may be checked every 5 years, or more frequently if you are over 109 years old. Skin check. Lung cancer screening. You may have this screening every year starting at age 4 if you have a 30-pack-year history of smoking and currently smoke or have quit within the past 15 years. Fecal occult blood test (FOBT) of the stool. You may have this test every year starting at age 61. Flexible sigmoidoscopy or colonoscopy. You may have a sigmoidoscopy every 5 years or a colonoscopy every 10 years starting at age 82. Hepatitis C blood test. Hepatitis B blood test. Sexually transmitted disease (STD) testing. Diabetes screening. This is done by checking your blood sugar (glucose) after you have not eaten for a while (fasting). You may have this done every 1-3 years. Bone density scan. This is done to  screen for osteoporosis. You may have this done starting at age 71. Mammogram. This may be done every 1-2 years. Talk to your health care provider about how often you should have regular mammograms. Talk with your health care provider about your test results, treatment options, and if necessary, the need for more tests. Vaccines  Your health care provider may recommend certain vaccines, such  as: Influenza vaccine. This is recommended every year. Tetanus, diphtheria, and acellular pertussis (Tdap, Td) vaccine. You may need a Td booster every 10 years. Zoster vaccine. You may need this after age 59. Pneumococcal 13-valent conjugate (PCV13) vaccine. One dose is recommended after age 38. Pneumococcal polysaccharide (PPSV23) vaccine. One dose is recommended after age 68. Talk to your health care provider about which screenings and vaccines you need and how often you need them. This information is not intended to replace advice given to you by your health care provider. Make sure you discuss any questions you have with your health care provider. Document Released: 11/25/2015 Document Revised: 07/18/2016 Document Reviewed: 08/30/2015 Elsevier Interactive Patient Education  2017 [place] Prevention in the Home Falls can cause injuries. They can happen to people of all ages. There are many things you can do to make your home safe and to help prevent falls. What can I do on the outside of my home? Regularly fix the edges of walkways and driveways and fix any cracks. Remove anything that might make you trip as you walk through a door, such as a raised step or threshold. Trim any bushes or trees on the path to your home. Use bright outdoor lighting. Clear any walking paths of anything that might make someone trip, such as rocks or tools. Regularly check to see if handrails are loose or broken. Make sure that both sides of any steps have handrails. Any raised decks and porches should have guardrails on the edges. Have any leaves, snow, or ice cleared regularly. Use sand or salt on walking paths during winter. Clean up any spills in your garage right away. This includes oil or grease spills. What can I do in the bathroom? Use night lights. Install grab bars by the toilet and in the tub and shower. Do not use towel bars as grab bars. Use non-skid mats or decals in the tub or  shower. If you need to sit down in the shower, use a plastic, non-slip stool. Keep the floor dry. Clean up any water that spills on the floor as soon as it happens. Remove soap buildup in the tub or shower regularly. Attach bath mats securely with double-sided non-slip rug tape. Do not have throw rugs and other things on the floor that can make you trip. What can I do in the bedroom? Use night lights. Make sure that you have a light by your bed that is easy to reach. Do not use any sheets or blankets that are too big for your bed. They should not hang down onto the floor. Have a firm chair that has side arms. You can use this for support while you get dressed. Do not have throw rugs and other things on the floor that can make you trip. What can I do in the kitchen? Clean up any spills right away. Avoid walking on wet floors. Keep items that you use a lot in easy-to-reach places. If you need to reach something above you, use a strong step stool that has a grab bar. Keep electrical cords out of the way.  Do not use floor polish or wax that makes floors slippery. If you must use wax, use non-skid floor wax. Do not have throw rugs and other things on the floor that can make you trip. What can I do with my stairs? Do not leave any items on the stairs. Make sure that there are handrails on both sides of the stairs and use them. Fix handrails that are broken or loose. Make sure that handrails are as long as the stairways. Check any carpeting to make sure that it is firmly attached to the stairs. Fix any carpet that is loose or worn. Avoid having throw rugs at the top or bottom of the stairs. If you do have throw rugs, attach them to the floor with carpet tape. Make sure that you have a light switch at the top of the stairs and the bottom of the stairs. If you do not have them, ask someone to add them for you. What else can I do to help prevent falls? Wear shoes that: Do not have high heels. Have  rubber bottoms. Are comfortable and fit you well. Are closed at the toe. Do not wear sandals. If you use a stepladder: Make sure that it is fully opened. Do not climb a closed stepladder. Make sure that both sides of the stepladder are locked into place. Ask someone to hold it for you, if possible. Clearly mark and make sure that you can see: Any grab bars or handrails. First and last steps. Where the edge of each step is. Use tools that help you move around (mobility aids) if they are needed. These include: Canes. Walkers. Scooters. Crutches. Turn on the lights when you go into a dark area. Replace any light bulbs as soon as they burn out. Set up your furniture so you have a clear path. Avoid moving your furniture around. If any of your floors are uneven, fix them. If there are any pets around you, be aware of where they are. Review your medicines with your doctor. Some medicines can make you feel dizzy. This can increase your chance of falling. Ask your doctor what other things that you can do to help prevent falls. This information is not intended to replace advice given to you by your health care provider. Make sure you discuss any questions you have with your health care provider. Document Released: 08/25/2009 Document Revised: 04/05/2016 Document Reviewed: 12/03/2014 Elsevier Interactive Patient Education  2017 [employer].

## 2022-08-27 ENCOUNTER — Other Ambulatory Visit: Payer: Self-pay | Admitting: Internal Medicine

## 2022-08-29 NOTE — Telephone Encounter (Signed)
Need to call and see if pt is still taking - since not on med list.

## 2022-09-02 ENCOUNTER — Other Ambulatory Visit: Payer: Self-pay | Admitting: Internal Medicine

## 2022-09-05 NOTE — Telephone Encounter (Signed)
Need to clarify if taking medication.  If so, ok to refill.

## 2022-09-21 ENCOUNTER — Ambulatory Visit (INDEPENDENT_AMBULATORY_CARE_PROVIDER_SITE_OTHER): Payer: Medicare Other | Admitting: Internal Medicine

## 2022-09-21 ENCOUNTER — Encounter: Payer: Self-pay | Admitting: Internal Medicine

## 2022-09-21 VITALS — BP 134/80 | HR 86 | Temp 97.8°F | Resp 16 | Ht 64.0 in | Wt 116.6 lb

## 2022-09-21 DIAGNOSIS — I10 Essential (primary) hypertension: Secondary | ICD-10-CM

## 2022-09-21 DIAGNOSIS — Z0001 Encounter for general adult medical examination with abnormal findings: Secondary | ICD-10-CM

## 2022-09-21 DIAGNOSIS — E039 Hypothyroidism, unspecified: Secondary | ICD-10-CM

## 2022-09-21 DIAGNOSIS — I712 Thoracic aortic aneurysm, without rupture, unspecified: Secondary | ICD-10-CM

## 2022-09-21 DIAGNOSIS — Z23 Encounter for immunization: Secondary | ICD-10-CM

## 2022-09-21 DIAGNOSIS — E7800 Pure hypercholesterolemia, unspecified: Secondary | ICD-10-CM

## 2022-09-21 DIAGNOSIS — N898 Other specified noninflammatory disorders of vagina: Secondary | ICD-10-CM | POA: Diagnosis not present

## 2022-09-21 DIAGNOSIS — K219 Gastro-esophageal reflux disease without esophagitis: Secondary | ICD-10-CM

## 2022-09-21 DIAGNOSIS — Z0000 Encounter for general adult medical examination without abnormal findings: Secondary | ICD-10-CM

## 2022-09-21 MED ORDER — NYSTATIN 100000 UNIT/GM EX CREA: 1.0000 | TOPICAL_CREAM | Freq: Two times a day (BID) | CUTANEOUS | 0 refills | Status: DC
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
## 2022-09-21 NOTE — Assessment & Plan Note (Signed)
Physical today 09/21/22.  Mammogram 04/20/20 - Birads I. She declined mammogram.  Colonoscopy 2011.

## 2022-09-22 ENCOUNTER — Encounter: Payer: Self-pay | Admitting: Internal Medicine

## 2022-09-22 DIAGNOSIS — N898 Other specified noninflammatory disorders of vagina: Secondary | ICD-10-CM | POA: Insufficient documentation

## 2022-09-22 NOTE — Assessment & Plan Note (Signed)
No acid reflux reported.  On Protonix.

## 2022-09-22 NOTE — Assessment & Plan Note (Signed)
On pravastatin.  Low cholesterol diet and exercise.  Follow lipid panel and liver function tests.   

## 2022-09-22 NOTE — Assessment & Plan Note (Signed)
Vaginal itching.  Declined pelvic exam. Nystatin cream.

## 2022-09-22 NOTE — Addendum Note (Signed)
Addended by: Alisa Graff on: 09/22/2022 07:54 PM   Modules accepted: Level of Service

## 2022-09-22 NOTE — Assessment & Plan Note (Signed)
Refer to Dr Mortimer Fries note.  Stable on last CT.

## 2022-09-22 NOTE — Assessment & Plan Note (Signed)
Blood pressure as outlined. Continue current medication regimen - amlodipine, hydralazine and losartan.  Follow pressures.  Follow metabolic panel.

## 2022-09-22 NOTE — Assessment & Plan Note (Signed)
On thyroid replacement.  Follow tsh.  

## 2022-09-24 ENCOUNTER — Other Ambulatory Visit: Payer: Self-pay

## 2022-09-24 DIAGNOSIS — Z23 Encounter for immunization: Secondary | ICD-10-CM

## 2022-11-11 DIAGNOSIS — R55 Syncope and collapse: Secondary | ICD-10-CM | POA: Diagnosis not present

## 2022-11-11 DIAGNOSIS — I10 Essential (primary) hypertension: Secondary | ICD-10-CM | POA: Diagnosis not present

## 2022-12-01 ENCOUNTER — Other Ambulatory Visit: Payer: Self-pay | Admitting: Internal Medicine

## 2023-01-21 ENCOUNTER — Other Ambulatory Visit (INDEPENDENT_AMBULATORY_CARE_PROVIDER_SITE_OTHER): Payer: Medicare Other

## 2023-01-21 DIAGNOSIS — E7800 Pure hypercholesterolemia, unspecified: Secondary | ICD-10-CM | POA: Diagnosis not present

## 2023-01-21 LAB — LIPID PANEL
Cholesterol: 191 mg/dL (ref 0–200)
HDL: 84.9 mg/dL (ref >39.00)
LDL Cholesterol: 97 mg/dL (ref 0–99)
NonHDL: 106.55
Total CHOL/HDL Ratio: 2
Triglycerides: 48 mg/dL (ref 0.0–149.0)
VLDL: 9.6 mg/dL (ref 0.0–40.0)

## 2023-01-21 LAB — HEPATIC FUNCTION PANEL
ALT: 6 U/L (ref 0–35)
AST: 17 U/L (ref 0–37)
Albumin: 3.9 g/dL (ref 3.5–5.2)
Alkaline Phosphatase: 80 U/L (ref 39–117)
Bilirubin, Direct: 0.1 mg/dL (ref 0.0–0.3)
Total Bilirubin: 0.8 mg/dL (ref 0.2–1.2)
Total Protein: 6.9 g/dL (ref 6.0–8.3)

## 2023-01-21 LAB — BASIC METABOLIC PANEL
BUN: 21 mg/dL (ref 6–23)
CO2: 25 mEq/L (ref 19–32)
Calcium: 9.2 mg/dL (ref 8.4–10.5)
Chloride: 103 mEq/L (ref 96–112)
Creatinine, Ser: 0.86 mg/dL (ref 0.40–1.20)
GFR: 60.51 mL/min (ref >60.00)
Glucose, Bld: 106 mg/dL — ABNORMAL HIGH (ref 70–99)
Potassium: 3.9 mEq/L (ref 3.5–5.1)
Sodium: 137 mEq/L (ref 135–145)

## 2023-01-24 ENCOUNTER — Ambulatory Visit: Payer: Medicare Other | Admitting: Internal Medicine

## 2023-02-14 ENCOUNTER — Ambulatory Visit: Payer: Medicare Other | Admitting: Internal Medicine

## 2023-04-03 ENCOUNTER — Encounter: Payer: Self-pay | Admitting: Internal Medicine

## 2023-04-03 ENCOUNTER — Ambulatory Visit (INDEPENDENT_AMBULATORY_CARE_PROVIDER_SITE_OTHER): Payer: Medicare Other | Admitting: Internal Medicine

## 2023-04-03 VITALS — BP 148/82 | HR 73 | Temp 97.9°F | Resp 16 | Ht 65.0 in | Wt 119.0 lb

## 2023-04-03 DIAGNOSIS — E039 Hypothyroidism, unspecified: Secondary | ICD-10-CM | POA: Diagnosis not present

## 2023-04-03 DIAGNOSIS — J42 Unspecified chronic bronchitis: Secondary | ICD-10-CM | POA: Diagnosis not present

## 2023-04-03 DIAGNOSIS — K219 Gastro-esophageal reflux disease without esophagitis: Secondary | ICD-10-CM

## 2023-04-03 DIAGNOSIS — E7800 Pure hypercholesterolemia, unspecified: Secondary | ICD-10-CM | POA: Diagnosis not present

## 2023-04-03 DIAGNOSIS — R55 Syncope and collapse: Secondary | ICD-10-CM | POA: Diagnosis not present

## 2023-04-03 DIAGNOSIS — I712 Thoracic aortic aneurysm, without rupture, unspecified: Secondary | ICD-10-CM | POA: Diagnosis not present

## 2023-04-03 DIAGNOSIS — Z9189 Other specified personal risk factors, not elsewhere classified: Secondary | ICD-10-CM

## 2023-04-03 DIAGNOSIS — I10 Essential (primary) hypertension: Secondary | ICD-10-CM | POA: Diagnosis not present

## 2023-04-03 DIAGNOSIS — I700 Atherosclerosis of aorta: Secondary | ICD-10-CM | POA: Diagnosis not present

## 2023-04-03 DIAGNOSIS — F439 Reaction to severe stress, unspecified: Secondary | ICD-10-CM

## 2023-04-03 LAB — BASIC METABOLIC PANEL
BUN: 21 mg/dL (ref 6–23)
CO2: 29 mEq/L (ref 19–32)
Calcium: 9.3 mg/dL (ref 8.4–10.5)
Chloride: 102 mEq/L (ref 96–112)
Creatinine, Ser: 0.87 mg/dL (ref 0.40–1.20)
GFR: 59.59 mL/min — ABNORMAL LOW (ref >60.00)
Glucose, Bld: 95 mg/dL (ref 70–99)
Potassium: 4.3 mEq/L (ref 3.5–5.1)
Sodium: 138 mEq/L (ref 135–145)

## 2023-04-03 LAB — CBC WITH DIFFERENTIAL/PLATELET
Basophils Absolute: 0 10*3/uL (ref 0.0–0.1)
Basophils Relative: 0.6 % (ref 0.0–3.0)
Eosinophils Absolute: 0.1 10*3/uL (ref 0.0–0.7)
Eosinophils Relative: 0.9 % (ref 0.0–5.0)
HCT: 42.7 % (ref 36.0–46.0)
Hemoglobin: 13.9 g/dL (ref 12.0–15.0)
Lymphocytes Relative: 31.7 % (ref 12.0–46.0)
Lymphs Abs: 2 10*3/uL (ref 0.7–4.0)
MCHC: 32.5 g/dL (ref 30.0–36.0)
MCV: 90.5 fl (ref 78.0–100.0)
Monocytes Absolute: 0.6 10*3/uL (ref 0.1–1.0)
Monocytes Relative: 9.2 % (ref 3.0–12.0)
Neutro Abs: 3.6 10*3/uL (ref 1.4–7.7)
Neutrophils Relative %: 57.6 % (ref 43.0–77.0)
Platelets: 271 10*3/uL (ref 150.0–400.0)
RBC: 4.72 Mil/uL (ref 3.87–5.11)
RDW: 13.1 % (ref 11.5–15.5)
WBC: 6.2 10*3/uL (ref 4.0–10.5)

## 2023-04-03 LAB — HEPATIC FUNCTION PANEL
ALT: 10 U/L (ref 0–35)
AST: 19 U/L (ref 0–37)
Albumin: 4.1 g/dL (ref 3.5–5.2)
Alkaline Phosphatase: 79 U/L (ref 39–117)
Bilirubin, Direct: 0.2 mg/dL (ref 0.0–0.3)
Total Bilirubin: 0.8 mg/dL (ref 0.2–1.2)
Total Protein: 7 g/dL (ref 6.0–8.3)

## 2023-04-03 LAB — TSH: TSH: 0.99 u[IU]/mL (ref 0.35–5.50)

## 2023-04-03 MED ORDER — NYSTATIN 100000 UNIT/GM EX CREA: 1.0000 | TOPICAL_CREAM | Freq: Two times a day (BID) | CUTANEOUS | 0 refills | Status: AC
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
## 2023-04-04 ENCOUNTER — Encounter: Payer: Self-pay | Admitting: Internal Medicine

## 2023-04-04 DIAGNOSIS — I700 Atherosclerosis of aorta: Secondary | ICD-10-CM | POA: Insufficient documentation

## 2023-04-04 NOTE — Assessment & Plan Note (Signed)
Continue pravastatin 

## 2023-04-04 NOTE — Assessment & Plan Note (Signed)
Has been evaluated by pulmonary.  No cough or congestion.  Breathing stable.

## 2023-04-04 NOTE — Assessment & Plan Note (Addendum)
Blood pressure as outlined. Recheck improved.  Continue current medication regimen - amlodipine, hydralazine and losartan.  Hold on adding medication. Follow pressures.  Send in readings. Follow metabolic panel.

## 2023-04-04 NOTE — Assessment & Plan Note (Signed)
On pravastatin.  Low cholesterol diet and exercise.  Follow lipid panel and liver function tests.   

## 2023-04-04 NOTE — Assessment & Plan Note (Signed)
No acid reflux reported.  On Protonix. 

## 2023-04-04 NOTE — Assessment & Plan Note (Signed)
History of syncope (for years).  Has had extensive w/up previously.  Saw neurology.  See Dr Margaretmary Eddy note for details.  Decided not to pursue further w/up at that time.  Has had cardiology evaluation.  Discussed recent episode.  Discussed further w/up and testing.  She declines.  States she has felt fine since occurred.  Stays active.  Will follow symptoms.  Notify me or be evaluated if any change or worsening symptoms or if recurs.  Discussed not driving.  Follow.

## 2023-04-04 NOTE — Assessment & Plan Note (Signed)
On thyroid replacement.  Follow tsh.  

## 2023-04-04 NOTE — Assessment & Plan Note (Signed)
Discussed with her and her daughter today. Discussed given recent syncope, risk of driving. She has friends and family that can transport her.

## 2023-04-04 NOTE — Assessment & Plan Note (Signed)
Has good support.  Overall doing well.  Follow.   

## 2023-04-04 NOTE — Assessment & Plan Note (Signed)
Refer to Dr Kasa note.  Stable on last CT.  

## 2023-04-05 ENCOUNTER — Telehealth: Payer: Self-pay

## 2023-04-05 NOTE — Telephone Encounter (Signed)
LMTCB in regards to lab results.  

## 2023-04-05 NOTE — Telephone Encounter (Signed)
Patient's daughter returned office phone call and note from Dr Lorin Picket was read.

## 2023-04-05 NOTE — Telephone Encounter (Signed)
noted 

## 2023-04-05 NOTE — Telephone Encounter (Signed)
-----   Message from Dale Caspian, MD sent at 04/04/2023  4:13 AM EDT ----- Notify - hgb, thyroid test and liver function tests are wnl. Kidney function relatively stable.

## 2023-04-24 ENCOUNTER — Other Ambulatory Visit: Payer: Self-pay | Admitting: Internal Medicine

## 2023-05-20 ENCOUNTER — Encounter: Payer: Self-pay | Admitting: Internal Medicine

## 2023-07-05 ENCOUNTER — Ambulatory Visit: Payer: Medicare Other | Admitting: Internal Medicine

## 2023-08-13 ENCOUNTER — Other Ambulatory Visit: Payer: Self-pay | Admitting: Internal Medicine

## 2023-08-21 ENCOUNTER — Ambulatory Visit: Payer: Medicare Other | Admitting: *Deleted

## 2023-08-21 VITALS — Ht 65.0 in | Wt 118.0 lb

## 2023-08-21 DIAGNOSIS — Z0000 Encounter for general adult medical examination without abnormal findings: Secondary | ICD-10-CM
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
## 2023-08-21 NOTE — Patient Instructions (Signed)
Kaitlyn Bowen , Thank you for taking time to come for your Medicare Wellness Visit. I appreciate your ongoing commitment to your health goals. Please review the following plan we discussed and let me know if I can assist you in the future.   Referrals/Orders/Follow-Ups/Clinician Recommendations: Remember to update your vaccines Tdap, (Tetanus)  Covid and Shingles  This is a list of the screening recommended for you and due dates:  Health Maintenance  Topic Date Due   DTaP/Tdap/Td vaccine (1 - Tdap) Never done   Zoster (Shingles) Vaccine (1 of 2) Never done   COVID-19 Vaccine (1 - 2023-24 season) Never done   Flu Shot  02/10/2024*   Medicare Annual Wellness Visit  08/20/2024   Pneumonia Vaccine  Completed   DEXA scan (bone density measurement)  Completed   HPV Vaccine  Aged Out  *Topic was postponed. The date shown is not the original due date.    Advanced directives: (Copy Requested) Please bring a copy of your health care power of attorney and living will to the office to be added to your chart at your convenience.  Next Medicare Annual Wellness Visit scheduled for next year: Yes 08/24/24 @ 3:45

## 2023-08-30 ENCOUNTER — Ambulatory Visit: Payer: Medicare Other | Admitting: Internal Medicine

## 2023-09-03 ENCOUNTER — Ambulatory Visit: Payer: Medicare Other | Admitting: Internal Medicine

## 2023-10-08 ENCOUNTER — Encounter: Payer: Self-pay | Admitting: Internal Medicine

## 2023-10-08 ENCOUNTER — Ambulatory Visit: Payer: Medicare Other | Admitting: Internal Medicine

## 2023-10-08 VITALS — BP 128/74 | HR 77 | Temp 98.0°F | Resp 16 | Ht 65.0 in | Wt 121.6 lb

## 2023-10-08 DIAGNOSIS — Z23 Encounter for immunization: Secondary | ICD-10-CM | POA: Diagnosis not present

## 2023-10-08 DIAGNOSIS — F439 Reaction to severe stress, unspecified: Secondary | ICD-10-CM

## 2023-10-08 DIAGNOSIS — I10 Essential (primary) hypertension: Secondary | ICD-10-CM

## 2023-10-08 DIAGNOSIS — R55 Syncope and collapse: Secondary | ICD-10-CM | POA: Diagnosis not present

## 2023-10-08 DIAGNOSIS — I700 Atherosclerosis of aorta: Secondary | ICD-10-CM | POA: Diagnosis not present

## 2023-10-08 DIAGNOSIS — J42 Unspecified chronic bronchitis: Secondary | ICD-10-CM

## 2023-10-08 DIAGNOSIS — I712 Thoracic aortic aneurysm, without rupture, unspecified: Secondary | ICD-10-CM | POA: Diagnosis not present

## 2023-10-08 DIAGNOSIS — E039 Hypothyroidism, unspecified: Secondary | ICD-10-CM | POA: Diagnosis not present

## 2023-10-08 DIAGNOSIS — E7800 Pure hypercholesterolemia, unspecified: Secondary | ICD-10-CM

## 2023-10-08 DIAGNOSIS — K219 Gastro-esophageal reflux disease without esophagitis: Secondary | ICD-10-CM

## 2023-10-08 DIAGNOSIS — E559 Vitamin D deficiency, unspecified: Secondary | ICD-10-CM

## 2023-10-08 MED ORDER — PRAVASTATIN SODIUM 40 MG PO TABS: 40.0000 mg | ORAL_TABLET | Freq: Every day | ORAL | 1 refills | Status: DC

## 2023-10-08 MED ORDER — AMLODIPINE BESYLATE 5 MG PO TABS: 5.0000 mg | ORAL_TABLET | Freq: Two times a day (BID) | ORAL | 1 refills | Status: DC

## 2023-10-08 MED ORDER — LEVOTHYROXINE SODIUM 88 MCG PO TABS: 88.0000 ug | ORAL_TABLET | Freq: Every day | ORAL | 1 refills | Status: DC

## 2023-10-08 MED ORDER — HYDRALAZINE HCL 10 MG PO TABS: 10.0000 mg | ORAL_TABLET | Freq: Three times a day (TID) | ORAL | 1 refills | Status: DC
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
## 2023-10-09 LAB — BASIC METABOLIC PANEL
BUN: 21 mg/dL (ref 6–23)
CO2: 29 meq/L (ref 19–32)
Calcium: 9 mg/dL (ref 8.4–10.5)
Chloride: 102 meq/L (ref 96–112)
Creatinine, Ser: 0.9 mg/dL (ref 0.40–1.20)
GFR: 57.01 mL/min — ABNORMAL LOW (ref >60.00)
Glucose, Bld: 92 mg/dL (ref 70–99)
Potassium: 3.9 meq/L (ref 3.5–5.1)
Sodium: 138 meq/L (ref 135–145)

## 2023-10-09 LAB — HEPATIC FUNCTION PANEL
ALT: 12 U/L (ref 0–35)
AST: 20 U/L (ref 0–37)
Albumin: 4.4 g/dL (ref 3.5–5.2)
Alkaline Phosphatase: 79 U/L (ref 39–117)
Bilirubin, Direct: 0.2 mg/dL (ref 0.0–0.3)
Total Bilirubin: 0.6 mg/dL (ref 0.2–1.2)
Total Protein: 7.3 g/dL (ref 6.0–8.3)

## 2023-10-09 LAB — LIPID PANEL
Cholesterol: 221 mg/dL — ABNORMAL HIGH (ref 0–200)
HDL: 86.2 mg/dL (ref >39.00)
LDL Cholesterol: 121 mg/dL — ABNORMAL HIGH (ref 0–99)
NonHDL: 134.56
Total CHOL/HDL Ratio: 3
Triglycerides: 66 mg/dL (ref 0.0–149.0)
VLDL: 13.2 mg/dL (ref 0.0–40.0)

## 2023-10-09 LAB — VITAMIN D 25 HYDROXY (VIT D DEFICIENCY, FRACTURES): VITD: 18.26 ng/mL — ABNORMAL LOW (ref 30.00–100.00)

## 2023-10-11 ENCOUNTER — Encounter: Payer: Self-pay | Admitting: Internal Medicine

## 2023-10-11 NOTE — Assessment & Plan Note (Signed)
Has good support.  Overall doing well.  Follow.  

## 2023-10-11 NOTE — Assessment & Plan Note (Signed)
Has been evaluated by pulmonary.  No cough or congestion.  Breathing stable.

## 2023-10-11 NOTE — Assessment & Plan Note (Signed)
Refer to Dr Kasa note.  Stable on last CT.  

## 2023-10-11 NOTE — Assessment & Plan Note (Signed)
Has had extensive w/up previously. Saw neurology. See Dr Margaretmary Eddy note for details. Decided not to pursue further w/up at that time. Has had cardiology evaluation. No further episodes.  Follow.

## 2023-10-11 NOTE — Assessment & Plan Note (Signed)
Blood pressure doing well. Continue current medication regimen - amlodipine, hydralazine and losartan.  Follow pressures. Follow metabolic panel.

## 2023-10-11 NOTE — Assessment & Plan Note (Signed)
Continue pravastatin 

## 2023-10-11 NOTE — Assessment & Plan Note (Signed)
On thyroid replacement.  Follow tsh.  

## 2023-10-11 NOTE — Assessment & Plan Note (Signed)
No acid reflux reported.  On Protonix.

## 2023-10-11 NOTE — Assessment & Plan Note (Signed)
On pravastatin.  Low cholesterol diet and exercise.  Follow lipid panel and liver function tests.   

## 2024-04-01 ENCOUNTER — Other Ambulatory Visit: Payer: Self-pay | Admitting: Internal Medicine

## 2024-08-24 ENCOUNTER — Ambulatory Visit: Payer: Medicare Other

## 2024-09-26 ENCOUNTER — Other Ambulatory Visit: Payer: Self-pay | Admitting: Internal Medicine

## 2024-09-29 NOTE — Telephone Encounter (Signed)
 open in error

## 2024-11-25 ENCOUNTER — Ambulatory Visit

## 2024-12-01 ENCOUNTER — Ambulatory Visit

## 2024-12-08 ENCOUNTER — Ambulatory Visit

## 2024-12-24 ENCOUNTER — Ambulatory Visit: Admitting: Internal Medicine

## 2025-02-05 ENCOUNTER — Ambulatory Visit

## 2025-02-09 ENCOUNTER — Ambulatory Visit: Admitting: Internal Medicine
